# Patient Record
Sex: Female | Born: 1995 | Race: Black or African American | Hispanic: No | Marital: Single | Smoking: Former smoker
Health system: Southern US, Community
[De-identification: ages and names within clinical notes are randomized; demographics above are authoritative.]

## PROBLEM LIST (undated history)

## (undated) DIAGNOSIS — Z202 Contact with and (suspected) exposure to infections with a predominantly sexual mode of transmission: Secondary | ICD-10-CM

## (undated) DIAGNOSIS — D649 Anemia, unspecified: Secondary | ICD-10-CM

## (undated) DIAGNOSIS — F909 Attention-deficit hyperactivity disorder, unspecified type: Secondary | ICD-10-CM

## (undated) HISTORY — DX: Attention-deficit hyperactivity disorder, unspecified type: F90.9

## (undated) HISTORY — DX: Anemia, unspecified: D64.9

## (undated) HISTORY — PX: OTHER SURGICAL HISTORY: SHX169

## (undated) HISTORY — DX: Contact with and (suspected) exposure to infections with a predominantly sexual mode of transmission: Z20.2

---
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
## 2015-03-01 ENCOUNTER — Ambulatory Visit
Admission: RE | Admit: 2015-03-01 | Discharge: 2015-03-01 | Disposition: A | Discharge status: Home / Self Care | Payer: Medicaid Other | Source: Ambulatory Visit | Attending: Pediatrics | Admitting: Pediatrics

## 2015-03-01 ENCOUNTER — Other Ambulatory Visit: Payer: Self-pay | Admitting: Pediatrics

## 2015-03-01 DIAGNOSIS — S93602A Unspecified sprain of left foot, initial encounter: Secondary | ICD-10-CM

## 2015-03-01 DIAGNOSIS — W19XXXA Unspecified fall, initial encounter: Secondary | ICD-10-CM | POA: Insufficient documentation

## 2015-06-05 ENCOUNTER — Encounter: Payer: Self-pay | Admitting: Obstetrics and Gynecology

## 2015-06-05 ENCOUNTER — Ambulatory Visit (INDEPENDENT_AMBULATORY_CARE_PROVIDER_SITE_OTHER): Payer: Medicaid Other | Admitting: Obstetrics and Gynecology

## 2015-06-05 VITALS — BP 118/82 | HR 93 | Ht 62.5 in | Wt 129.4 lb

## 2015-06-05 DIAGNOSIS — R350 Frequency of micturition: Secondary | ICD-10-CM

## 2015-06-05 DIAGNOSIS — Z30018 Encounter for initial prescription of other contraceptives: Secondary | ICD-10-CM

## 2015-06-05 DIAGNOSIS — Z3049 Encounter for surveillance of other contraceptives: Secondary | ICD-10-CM

## 2015-06-05 LAB — POCT URINALYSIS DIPSTICK
Bilirubin, UA: NEGATIVE
Glucose, UA: NEGATIVE
KETONES UA: NEGATIVE
LEUKOCYTES UA: NEGATIVE
Nitrite, UA: NEGATIVE
PH UA: 7.5
PROTEIN UA: NEGATIVE
SPEC GRAV UA: 1.015
Urobilinogen, UA: NEGATIVE

## 2015-06-05 LAB — POCT URINE PREGNANCY: PREG TEST UR: NEGATIVE

## 2015-06-05 NOTE — Progress Notes (Signed)
GYNECOLOGY CLINIC PROGRESS NOTE  Subjective:    Brandi Watkins is a 19 y.o. P0 female who presents for contraception counseling. The patient has no complaints today. The patient is sexually active, currently 1 partner.  Coitarche at age 57. Pertinent past medical history: none.  Has used OCPs in the past, but discontinued due to them "making her sick".  Menstrual History: OB History    Gravida Para Term Preterm AB TAB SAB Ectopic Multiple Living   0 0 0 0 0 0 0 0 0 0       Menarche age: 10 No LMP recorded.    Past Medical History  Diagnosis Date  . Anemia   . ADHD (attention deficit hyperactivity disorder)     Family History  Problem Relation Age of Onset  . Diabetes Mother   . Hypertension Mother   . Asthma Maternal Aunt      Past Surgical History  Procedure Laterality Date  . None      Social History   Social History  . Marital Status: Single    Spouse Name: N/A  . Number of Children: N/A  . Years of Education: N/A   Occupational History  . Not on file.   Social History Main Topics  . Smoking status: Former Research scientist (life sciences)  . Smokeless tobacco: Never Used  . Alcohol Use: Yes  . Drug Use: Yes    Special: Marijuana  . Sexual Activity: Yes    Birth Control/ Protection: Condom   Other Topics Concern  . Not on file   Social History Narrative  . No narrative on file    No current outpatient prescriptions on file prior to visit.   No current facility-administered medications on file prior to visit.    No Known Allergies  Review of Systems A comprehensive review of systems was negative except for: Genitourinary: positive for frequency   Objective:  Blood pressure 118/82, pulse 93, height 5' 2.5" (1.588 m), weight 129 lb 6.4 oz (58.695 kg), last menstrual period 06/03/2015.  General appearance: NAD Remainder of exam deferred.     Labs:   Results for orders placed or performed in visit on 06/05/15  POCT urine pregnancy  Result Value Ref Range   Preg  Test, Ur Negative Negative  POCT urinalysis dipstick  Result Value Ref Range   Color, UA YELLOW    Clarity, UA CLEAR    Glucose, UA NEGATIVE    Bilirubin, UA NEGATIVE    Ketones, UA NEGATIVE    Spec Grav, UA 1.015    Blood, UA SMALL    pH, UA 7.5    Protein, UA NEGATIVE    Urobilinogen, UA negative    Nitrite, UA NEGATIVE    Leukocytes, UA Negative Negative    Assessment:    19 y.o., unsure of contraceptive desires, no contraindications.   Urinary freqency  Plan:  Reviewed all forms of birth control options available including abstinence; over the counter/barrier methods; hormonal contraceptive medication including pill, patch, ring, injection,contraceptive implant; hormonal and nonhormonal IUDs; permanent sterilization options including vasectomy and the various tubal sterilization modalities. Risks and benefits reviewed.  Questions were answered.  Information was given to patient to review.   After discussion, patient has decided on Nexplanon.  Will have patient f/u in 1 week for insertion.  UA negative today, UPT negative.  Patient informed.     Rubie Maid, MD [facility]

## 2015-06-12 ENCOUNTER — Ambulatory Visit (INDEPENDENT_AMBULATORY_CARE_PROVIDER_SITE_OTHER): Payer: Medicaid Other | Admitting: Obstetrics and Gynecology

## 2015-06-12 ENCOUNTER — Encounter: Payer: Self-pay | Admitting: Obstetrics and Gynecology

## 2015-06-12 VITALS — BP 104/68 | Ht 62.5 in | Wt 128.7 lb

## 2015-06-12 DIAGNOSIS — Z30017 Encounter for initial prescription of implantable subdermal contraceptive: Secondary | ICD-10-CM

## 2015-06-12 NOTE — Patient Instructions (Signed)
Etonogestrel implant What is this medicine? ETONOGESTREL (et oh noe JES trel) is a contraceptive (birth control) device. It is used to prevent pregnancy. It can be used for up to 3 years. This medicine may be used for other purposes; ask your health care provider or pharmacist if you have questions. What should I tell my health care provider before I take this medicine? They need to know if you have any of these conditions: -abnormal vaginal bleeding -blood vessel disease or blood clots -cancer of the breast, cervix, or liver -depression -diabetes -gallbladder disease -headaches -heart disease or recent heart attack -high blood pressure -high cholesterol -kidney disease -liver disease -renal disease -seizures -tobacco smoker -an unusual or allergic reaction to etonogestrel, other hormones, anesthetics or antiseptics, medicines, foods, dyes, or preservatives -pregnant or trying to get pregnant -breast-feeding How should I use this medicine? This device is inserted just under the skin on the inner side of your upper arm by a health care professional. Talk to your pediatrician regarding the use of this medicine in children. Special care may be needed. Overdosage: If you think you have taken too much of this medicine contact a poison control center or emergency room at once. NOTE: This medicine is only for you. Do not share this medicine with others. What if I miss a dose? This does not apply. What may interact with this medicine? Do not take this medicine with any of the following medications: -amprenavir -bosentan -fosamprenavir This medicine may also interact with the following medications: -barbiturate medicines for inducing sleep or treating seizures -certain medicines for fungal infections like ketoconazole and itraconazole -griseofulvin -medicines to treat seizures like carbamazepine, felbamate, oxcarbazepine, phenytoin,  topiramate -modafinil -phenylbutazone -rifampin -some medicines to treat HIV infection like atazanavir, indinavir, lopinavir, nelfinavir, tipranavir, ritonavir -St. John's wort This list may not describe all possible interactions. Give your health care provider a list of all the medicines, herbs, non-prescription drugs, or dietary supplements you use. Also tell them if you smoke, drink alcohol, or use illegal drugs. Some items may interact with your medicine. What should I watch for while using this medicine? This product does not protect you against HIV infection (AIDS) or other sexually transmitted diseases. You should be able to feel the implant by pressing your fingertips over the skin where it was inserted. Contact your doctor if you cannot feel the implant, and use a non-hormonal birth control method (such as condoms) until your doctor confirms that the implant is in place. If you feel that the implant may have broken or become bent while in your arm, contact your healthcare provider. What side effects may I notice from receiving this medicine? Side effects that you should report to your doctor or health care professional as soon as possible: -allergic reactions like skin rash, itching or hives, swelling of the face, lips, or tongue -breast lumps -changes in emotions or moods -depressed mood -heavy or prolonged menstrual bleeding -pain, irritation, swelling, or bruising at the insertion site -scar at site of insertion -signs of infection at the insertion site such as fever, and skin redness, pain or discharge -signs of pregnancy -signs and symptoms of a blood clot such as breathing problems; changes in vision; chest pain; severe, sudden headache; pain, swelling, warmth in the leg; trouble speaking; sudden numbness or weakness of the face, arm or leg -signs and symptoms of liver injury like dark yellow or brown urine; general ill feeling or flu-like symptoms; light-colored stools; loss of  appetite; nausea; right upper belly   pain; unusually weak or tired; yellowing of the eyes or skin -unusual vaginal bleeding, discharge -signs and symptoms of a stroke like changes in vision; confusion; trouble speaking or understanding; severe headaches; sudden numbness or weakness of the face, arm or leg; trouble walking; dizziness; loss of balance or coordination Side effects that usually do not require medical attention (Report these to your doctor or health care professional if they continue or are bothersome.): -acne -back pain -breast pain -changes in weight -dizziness -general ill feeling or flu-like symptoms -headache -irregular menstrual bleeding -nausea -sore throat -vaginal irritation or inflammation This list may not describe all possible side effects. Call your doctor for medical advice about side effects. You may report side effects to FDA at 1-800-FDA-1088. Where should I keep my medicine? This drug is given in a hospital or clinic and will not be stored at home. NOTE: This sheet is a summary. It may not cover all possible information. If you have questions about this medicine, talk to your doctor, pharmacist, or health care provider.    2016, Elsevier/Gold Standard. (2014-04-13 14:07:06)  

## 2015-06-12 NOTE — Progress Notes (Signed)
GYNECOLOGY CLINIC PROCEDURE NOTE  Brandi Watkins is a 19 y.o. G0P0000 here for Nexplanon insertion. No pap history.  No other gynecologic concerns.  Patient's last menstrual period was 06/12/2015. Still ongoing.   Vitals:  Blood pressure 104/68, height 5' 2.5" (1.588 m), weight 128 lb 11.2 oz (58.378 kg), last menstrual period 06/12/2015.  Nexplanon Insertion Procedure Patient identified, informed consent performed, consent signed.   Patient does understand that irregular bleeding is a very common side effect of this medication. She was advised to have backup contraception for one week after placement. Pregnancy test not performed as patient still currently on menses.  Patient's left arm was prepped and draped in the usual sterile fashion.. The ruler used to measure and mark insertion area.  Patient was prepped with alcohol swab and then injected with 3 ml of 1% lidocaine.  She was prepped with betadine, Nexplanon removed from packaging,  Device confirmed in needle, then inserted full length of needle and withdrawn per handbook instructions. Nexplanon was able to palpated in the patient's arm; patient palpated the insert herself. There was minimal blood loss.  Patient insertion site covered with guaze and a pressure bandage to reduce any bruising.  The patient tolerated the procedure well and was given post procedure instructions.     Rubie Maid, MD [facility]

## 2015-06-15 ENCOUNTER — Emergency Department
Admission: EM | Admit: 2015-06-15 | Discharge: 2015-06-15 | Disposition: A | Discharge status: Home / Self Care | Payer: Medicaid Other | Attending: Emergency Medicine | Admitting: Emergency Medicine

## 2015-06-15 ENCOUNTER — Encounter: Payer: Self-pay | Admitting: Emergency Medicine

## 2015-06-15 DIAGNOSIS — S60541A External constriction of right hand, initial encounter: Secondary | ICD-10-CM | POA: Insufficient documentation

## 2015-06-15 DIAGNOSIS — Z87891 Personal history of nicotine dependence: Secondary | ICD-10-CM | POA: Diagnosis not present

## 2015-06-15 DIAGNOSIS — Y9289 Other specified places as the place of occurrence of the external cause: Secondary | ICD-10-CM | POA: Diagnosis not present

## 2015-06-15 DIAGNOSIS — W4904XA Ring or other jewelry causing external constriction, initial encounter: Secondary | ICD-10-CM | POA: Insufficient documentation

## 2015-06-15 DIAGNOSIS — Y9389 Activity, other specified: Secondary | ICD-10-CM | POA: Insufficient documentation

## 2015-06-15 DIAGNOSIS — S6991XA Unspecified injury of right wrist, hand and finger(s), initial encounter: Secondary | ICD-10-CM | POA: Diagnosis present

## 2015-06-15 DIAGNOSIS — Y998 Other external cause status: Secondary | ICD-10-CM | POA: Diagnosis not present

## 2015-06-15 DIAGNOSIS — Z79899 Other long term (current) drug therapy: Secondary | ICD-10-CM | POA: Insufficient documentation

## 2015-06-15 NOTE — ED Provider Notes (Signed)
[facility] Emergency Department Provider Note ___________________________________________  Time seen: Approximately 10:17 AM  I have reviewed the triage vital signs and the nursing notes.   HISTORY  Chief Complaint Hand Pain   HPI Brandi Watkins is a 19 y.o. female who presents to the emergency department for removal. She states last night she put a ring on her right fifth finger and is unable to remove it. She requests that it be cut off.   Past Medical History  Diagnosis Date  . Anemia   . ADHD (attention deficit hyperactivity disorder)     There are no active problems to display for this patient.   Past Surgical History  Procedure Laterality Date  . None      Current Outpatient Rx  Name  Route  Sig  Dispense  Refill  . etonogestrel (NEXPLANON) 68 MG IMPL implant   Subdermal   1 each by Subdermal route once.           Allergies Review of patient's allergies indicates no known allergies.  Family History  Problem Relation Age of Onset  . Diabetes Mother   . Hypertension Mother   . Asthma Maternal Aunt     Social History Social History  Substance Use Topics  . Smoking status: Former Research scientist (life sciences)  . Smokeless tobacco: Never Used  . Alcohol Use: Yes    Review of Systems Constitutional: No recent illness. Eyes: No visual changes. ENT: No sore throat. Cardiovascular: Denies chest pain or palpitations. Respiratory: Denies shortness of breath. Gastrointestinal: No abdominal pain.  Genitourinary: Negative for dysuria. Musculoskeletal: Pain in right fifth finger Skin: Negative for rash. Neurological: Negative for headaches, focal weakness or numbness. 10-point ROS otherwise negative.  ____________________________________________   PHYSICAL EXAM:  VITAL SIGNS: ED Triage Vitals  Enc Vitals Group     BP 06/15/15 0926 115/67 mmHg     Pulse Rate 06/15/15 0926 90     Resp 06/15/15 0926 18     Temp 06/15/15 0926 98.2 F (36.8 C)      Temp Source 06/15/15 0926 Oral     SpO2 06/15/15 0926 100 %     Weight 06/15/15 0926 120 lb (54.432 kg)     Height 06/15/15 0926 5\' 2"  (1.575 m)     Head Cir --      Peak Flow --      Pain Score --      Pain Loc --      Pain Edu? --      Excl. in [place]? --     Constitutional: Alert and oriented. Well appearing and in no acute distress. Eyes: Conjunctivae are normal. EOMI. Head: Atraumatic. Nose: No congestion/rhinnorhea. Neck: No stridor.  Respiratory: Normal respiratory effort.   Musculoskeletal: Mild swelling of the right fifth finger noted with a ring causing constriction. Neurologic:  Normal speech and language. No gross focal neurologic deficits are appreciated. Speech is normal. No gait instability. Skin:  Skin is warm, dry and intact. Atraumatic. Psychiatric: Mood and affect are normal. Speech and behavior are normal.  ____________________________________________   LABS (all labs ordered are listed, but only abnormal results are displayed)  Labs Reviewed - No data to display ____________________________________________  RADIOLOGY   ____________________________________________   PROCEDURES  Procedure(s) performed:   Ring removed with an electronic ring cutter. No immediate complications.   ____________________________________________   INITIAL IMPRESSION / ASSESSMENT AND PLAN / ED COURSE  Pertinent labs & imaging results that were available during my care of the  patient were reviewed by me and considered in my medical decision making (see chart for details).  Offer was made to remove the ring intact however patient refused and requested that the ring be cut off. He was handed to the patient after removal. Patient was advised follow-up with her primary care provider or return to the emergency department for symptoms of concern. ____________________________________________   FINAL CLINICAL IMPRESSION(S) / ED DIAGNOSES  Final diagnoses:  Ring or other  jewelry causing external constriction, initial encounter       Victorino Dike, FNP 06/15/15 [place], MD 06/15/15 [id number]

## 2015-06-15 NOTE — ED Notes (Signed)
Reports putting a ring on her pinky finger last pm and it is stuck.  Mild swelling noted around it.

## 2015-06-15 NOTE — ED Notes (Signed)
Pt presents with ring to right fifth finger she states she cannot remove and is cutting off her circulation. Pt requests removal. Capillary refill brisk, finger slightly swollen and red.

## 2015-07-30 ENCOUNTER — Ambulatory Visit (INDEPENDENT_AMBULATORY_CARE_PROVIDER_SITE_OTHER): Payer: Medicaid Other | Admitting: Obstetrics and Gynecology

## 2015-07-30 ENCOUNTER — Encounter: Payer: Self-pay | Admitting: Obstetrics and Gynecology

## 2015-07-30 VITALS — BP 123/79 | HR 86 | Wt 128.4 lb

## 2015-07-30 DIAGNOSIS — N921 Excessive and frequent menstruation with irregular cycle: Secondary | ICD-10-CM | POA: Diagnosis not present

## 2015-07-30 DIAGNOSIS — R5383 Other fatigue: Secondary | ICD-10-CM | POA: Diagnosis not present

## 2015-07-30 DIAGNOSIS — Z975 Presence of (intrauterine) contraceptive device: Secondary | ICD-10-CM | POA: Diagnosis not present

## 2015-07-30 NOTE — Progress Notes (Signed)
    GYNECOLOGY PROGRESS NOTE  Subjective:    Patient ID: Brandi Watkins, female    DOB: January 09, 1996, 20 y.o.   MRN: [id number]  HPI  Patient is a 20 y.o. G0P0 female who presents for complaints of persistent bleeding on Nexplanon and worries of being anemic as she has a prior h/o anemia. Notes that the first week after Nexplanon she bled heavily (using 3-4 super tampons per day), then the following weeks were lighter bleeding requiring only 1 tampon per day. Has symptoms of fatigue, feeling dizzy. Has had Nexplanon in for ~ 6 weeks.  Notes that the bleeding finally stopped 2 days ago.   The following portions of the patient's history were reviewed and updated as appropriate: allergies, current medications, past family history, past medical history, past social history, past surgical history and problem list.  Review of Systems Pertinent items noted in HPI and remainder of comprehensive ROS otherwise negative.   Objective:   Blood pressure 123/79, pulse 86, weight 128 lb 7 oz (58.259 kg), last menstrual period 06/29/2015. General appearance: alert and no distress  Eyes: negative findings: lids and lashes normal and pupils equal, round, reactive to light and accomodation, positive findings: sclera clear, conjunctivae with mild pallor Pelvic: deferred Neurologic: Grossly normal   Assessment:   Breakthrough bleeding with Nexplanon Fatigue and dizziness H/o anemia  Plan:   Breakthrough bleeding appears to have spontaneously resolved.  Reiterated to patient that abnormal bleeding can occur for up to 3 months (and sometimes as long as 6 months) after Nexplanon placement.  Patient notes understanding.  Will order Hgb/Hct to assess for signs of anemia, as patient reports h/o anemia in the past.  Advised on taking multivitamin daily which contains iron, and if labs show evidence of anemia, may need additional iron supplementation.  Also advised on adequate hydration for symptoms (as patient  notes that she does not consume enough water daily).  RTC as needed.    Rubie Maid, MD [facility]

## 2015-07-30 NOTE — Progress Notes (Signed)
Patient ID: Brandi Watkins, female   DOB: 1996/01/05, 20 y.o.   MRN: [id number] Pt presents for c/o vaginal bleeding with nexplanon. Bled after nexplanon inserted x 1 month. LMP: 06/29/2015. Has history of anemia and pt states she feels as though she may be anemic now. C/O fatigue, dizziness (before leaving for work, when she got to work and also gets dizzy at night, bruises easily.

## 2015-07-31 ENCOUNTER — Telehealth: Payer: Self-pay

## 2015-07-31 ENCOUNTER — Ambulatory Visit: Payer: Medicaid Other | Admitting: Obstetrics and Gynecology

## 2015-07-31 LAB — HEMOGLOBIN AND HEMATOCRIT, BLOOD
Hematocrit: 40.2 % (ref 34.0–46.6)
Hemoglobin: 13.5 g/dL (ref 11.1–15.9)

## 2015-07-31 NOTE — Telephone Encounter (Signed)
-----   Message from Rubie Maid, MD sent at 07/31/2015  8:50 AM EST ----- Please inform patient that her Hgb is normal.  Advised on adequate hydration (6-8 cups of water daily) for dizziness, and taking multivitamin.

## 2015-07-31 NOTE — Telephone Encounter (Signed)
Called pt left vm informing her of information below.

## 2015-09-17 ENCOUNTER — Encounter: Payer: Self-pay | Admitting: Obstetrics and Gynecology

## 2015-09-17 ENCOUNTER — Ambulatory Visit (INDEPENDENT_AMBULATORY_CARE_PROVIDER_SITE_OTHER): Payer: Medicaid Other | Admitting: Obstetrics and Gynecology

## 2015-09-17 VITALS — BP 105/68 | HR 111 | Ht 62.0 in | Wt 128.0 lb

## 2015-09-17 DIAGNOSIS — N921 Excessive and frequent menstruation with irregular cycle: Secondary | ICD-10-CM | POA: Diagnosis not present

## 2015-09-17 DIAGNOSIS — Z975 Presence of (intrauterine) contraceptive device: Secondary | ICD-10-CM

## 2015-09-17 DIAGNOSIS — R531 Weakness: Secondary | ICD-10-CM

## 2015-09-17 MED ORDER — ESTRADIOL 1 MG PO TABS: 1.0000 mg | ORAL_TABLET | Freq: Every day | ORAL | Status: DC

## 2015-09-18 ENCOUNTER — Telehealth: Payer: Self-pay

## 2015-09-18 LAB — HEMOGLOBIN: HEMOGLOBIN: 13 g/dL (ref 11.1–15.9)

## 2015-09-18 NOTE — Telephone Encounter (Signed)
Called pt LM for her informing her of information below.

## 2015-09-18 NOTE — Telephone Encounter (Signed)
-----   Message from Rubie Maid, MD sent at 09/18/2015 11:57 AM EST ----- Please call and inform patient that her Hgb remains stable (normal range, currently 13)

## 2015-09-22 NOTE — Progress Notes (Signed)
    GYNECOLOGY PROGRESS NOTE  Subjective:    Patient ID: Brandi Watkins, female    DOB: 10-20-1995, 20 y.o.   MRN: [id number]  Dizziness  Vaginal Bleeding    Patient is a 20 y.o. G0P0 female who presents for complaints of persistent bleeding on Nexplanon and is still worried of being anemic as she has a prior h/o anemia. Reports that she is still having daily bleeding, sometimes heavy, sometimes light (i.e. Spotting).  Still notes symptoms of of fatigue and weakness. Has had Nexplanon in place for 3 months.     The following portions of the patient's history were reviewed and updated as appropriate: allergies, current medications, past family history, past medical history, past social history, past surgical history and problem list.  Review of Systems Pertinent items noted in HPI and remainder of comprehensive ROS otherwise negative.   Objective:   Blood pressure 105/68, pulse 111, height 5\' 2"  (1.575 m), weight 128 lb (58.06 kg), last menstrual period 09/17/2015. General appearance: alert and no distress  Head: Normocephalic, without obvious abnormality, atraumatic Eyes: conjunctivae/corneas clear. PERRL, EOM's intact.  Ears: normal external ear canals both ears Nose: Nares normal. Septum midline. Mucosa normal. No drainage or sinus tenderness. Throat: lips, mucosa, and tongue normal; teeth and gums normal Neck: no adenopathy, no carotid bruit, no JVD, supple, symmetrical, trachea midline and thyroid not enlarged, symmetric, no tenderness/mass/nodules Lungs: clear to auscultation bilaterally Heart: regular rate and rhythm, S1, S2 normal, no murmur, click, rub or gallop Pelvic: deferred Neurologic: Grossly normal   Results for ZYAUNA, DOMENICO (MRN [id number]) as of 09/17/2015 14:36  Ref. Range 07/30/2015 15:38  Hemoglobin Latest Ref Range: 11.1-15.9 g/dL 13.5  HCT Latest Ref Range: 34.0-46.6 % 40.2   . Assessment:   Breakthrough bleeding with Nexplanon Fatigue and  weakness H/o anemia  Plan:   Breakthrough bleeding persistent (although previously had resolved by last visit).  Reiterated to patient that abnormal bleeding can occur for up to 3 months (and sometimes as long as 6 months) after Nexplanon placement.  Discussed option of trial of supplemental estrogen for 1-2 months vs removal of Nexplanon.  Patient desires to try Estrogen.  Will prescribe Estradiol 1 mg x 14 days per month x 2 months. If still no improvement in symptoms, can remove Nexplanon and discuss other forms of contraception.  Will recheck Hgb to assess for signs of anemia, as patient reports h/o anemia in the past and is insistent on rechecking levels, although levels checked 2 months ago were normal.   RTC in 2-3 months after add back estrogen therapy trial.     Rubie Maid, MD [facility]

## 2015-10-09 ENCOUNTER — Ambulatory Visit: Payer: Medicaid Other | Admitting: Obstetrics and Gynecology

## 2015-10-27 ENCOUNTER — Encounter: Payer: Self-pay | Admitting: Emergency Medicine

## 2015-10-27 ENCOUNTER — Emergency Department
Admission: EM | Admit: 2015-10-27 | Discharge: 2015-10-27 | Disposition: A | Discharge status: Home / Self Care | Payer: Medicaid Other | Attending: Emergency Medicine | Admitting: Emergency Medicine

## 2015-10-27 DIAGNOSIS — Y999 Unspecified external cause status: Secondary | ICD-10-CM | POA: Insufficient documentation

## 2015-10-27 DIAGNOSIS — R21 Rash and other nonspecific skin eruption: Secondary | ICD-10-CM | POA: Insufficient documentation

## 2015-10-27 DIAGNOSIS — Z87891 Personal history of nicotine dependence: Secondary | ICD-10-CM | POA: Insufficient documentation

## 2015-10-27 DIAGNOSIS — W57XXXA Bitten or stung by nonvenomous insect and other nonvenomous arthropods, initial encounter: Secondary | ICD-10-CM | POA: Insufficient documentation

## 2015-10-27 DIAGNOSIS — Y929 Unspecified place or not applicable: Secondary | ICD-10-CM | POA: Insufficient documentation

## 2015-10-27 DIAGNOSIS — Y939 Activity, unspecified: Secondary | ICD-10-CM | POA: Insufficient documentation

## 2015-10-27 DIAGNOSIS — F909 Attention-deficit hyperactivity disorder, unspecified type: Secondary | ICD-10-CM | POA: Insufficient documentation

## 2015-10-27 MED ORDER — DIPHENHYDRAMINE HCL 2 % EX CREA: TOPICAL_CREAM | Freq: Three times a day (TID) | CUTANEOUS | Status: DC | PRN

## 2015-10-27 MED ORDER — CEPHALEXIN 500 MG PO CAPS: 500.0000 mg | ORAL_CAPSULE | Freq: Three times a day (TID) | ORAL | Status: DC

## 2015-10-27 NOTE — ED Notes (Signed)
Pt presents with 'spider bites" to the top of each breast; did not see anything bite her; noticed yesterday; c/o itching to areas

## 2015-10-27 NOTE — Discharge Instructions (Signed)
As we discussed please use a topical Benadryl cream 3 times daily for the next 3-4 days. If symptoms do not resolve or the redness begins to spread or you develop a fever please fill and begin taking her prescribed antibiotic. If the rash appears to be resolving with the use of topical Benadryl please do not fill the antibiotic. Return to the emergency department for any worsening symptoms, otherwise please follow-up with your primary care physician in 2-3 days for recheck/reevaluation.     Rash    A rash is a change in the color or texture of the skin. There are many different types of rashes. You may have other problems that accompany your rash.  CAUSES  Infections.  Allergic reactions. This can include allergies to pets or foods.  Certain medicines.  Exposure to certain chemicals, soaps, or cosmetics.  Heat.  Exposure to poisonous plants.  Tumors, both cancerous and noncancerous. SYMPTOMS  Redness.  Scaly skin.  Itchy skin.  Dry or cracked skin.  Bumps.  Blisters.  Pain. DIAGNOSIS  Your caregiver may do a physical exam to determine what type of rash you have. A skin sample (biopsy) may be taken and examined under a microscope.  TREATMENT  Treatment depends on the type of rash you have. Your caregiver may prescribe certain medicines. For serious conditions, you may need to see a skin doctor (dermatologist).  HOME CARE INSTRUCTIONS  Avoid the substance that caused your rash.  Do not scratch your rash. This can cause infection.  You may take cool baths to help stop itching.  Only take over-the-counter or prescription medicines as directed by your caregiver.  Keep all follow-up appointments as directed by your caregiver. SEEK IMMEDIATE MEDICAL CARE IF:  You have increasing pain, swelling, or redness.  You have a fever.  You have new or severe symptoms.  You have body aches, diarrhea, or vomiting.  Your rash is not better after 3 days. MAKE SURE YOU:  Understand these  instructions.  Will watch your condition.  Will get help right away if you are not doing well or get worse. This information is not intended to replace advice given to you by your health care provider. Make sure you discuss any questions you have with your health care provider.  Document Released: 06/19/2002 Document Revised: 07/20/2014 Document Reviewed: 11/14/2014  Elsevier Interactive Patient Education [employer].

## 2015-10-27 NOTE — ED Notes (Signed)
Pt in via triage w/ complaints of two spider bites, one located on each breast, both areas red, swollen, itching.  Pt denies any pain/tenderness.

## 2015-10-27 NOTE — ED Provider Notes (Signed)
[facility] Emergency Department Provider Note  Time seen: 10:25 PM  I have reviewed the triage vital signs and the nursing notes.   HISTORY  Chief Complaint Insect Bite    HPI Brandi Watkins is a 20 y.o. female with a past medical history of anemia, ADHD, presents the emergency department with possible spider bites. According to the patient since last night she has noticed to read itching areas to the top of both breasts. Denies any pain. Denies any known bites. Denies any fever or vomiting. Patient states moderate itching and redness.     Past Medical History  Diagnosis Date  . Anemia   . ADHD (attention deficit hyperactivity disorder)     There are no active problems to display for this patient.   Past Surgical History  Procedure Laterality Date  . None      Current Outpatient Rx  Name  Route  Sig  Dispense  Refill  . estradiol (ESTRACE) 1 MG tablet   Oral   Take 1 tablet (1 mg total) by mouth daily. 1 tablet daily x 14 days each month, for 2 months.   30 tablet   0   . etonogestrel (NEXPLANON) 68 MG IMPL implant   Subdermal   1 each by Subdermal route once.           Allergies Review of patient's allergies indicates no known allergies.  Family History  Problem Relation Age of Onset  . Diabetes Mother   . Hypertension Mother   . Asthma Maternal Aunt     Social History Social History  Substance Use Topics  . Smoking status: Former Research scientist (life sciences)  . Smokeless tobacco: Never Used  . Alcohol Use: No    Review of Systems Constitutional: Negative for fever. Cardiovascular: Negative for chest pain. Respiratory: Negative for shortness of breath. Gastrointestinal: Negative for abdominal pain Musculoskeletal: Negative for back pain. Skin: Red areas to the top of each breast/chest  10-point ROS otherwise negative.  ____________________________________________   PHYSICAL EXAM:  VITAL SIGNS: ED Triage Vitals  Enc Vitals Group      BP 10/27/15 2147 131/81 mmHg     Pulse Rate 10/27/15 2147 90     Resp 10/27/15 2147 18     Temp 10/27/15 2147 98.3 F (36.8 C)     Temp Source 10/27/15 2147 Oral     SpO2 10/27/15 2147 100 %     Weight 10/27/15 2147 129 lb (58.514 kg)     Height 10/27/15 2147 5\' 2"  (1.575 m)     Head Cir --      Peak Flow --      Pain Score 10/27/15 2148 3     Pain Loc --      Pain Edu? --      Excl. in [place]? --    Constitutional: Alert and oriented. Well appearing and in no distress. Eyes: Normal exam ENT   Head: Normocephalic and atraumatic.   Mouth/Throat: Mucous membranes are moist. Cardiovascular: Normal rate, regular rhythm.  Respiratory: Normal respiratory effort without tachypnea nor retractions. Breath sounds are clear  Gastrointestinal: Soft and nontender. No distention.  Musculoskeletal:Normal range of motion, ambulates without issue.  Neurologic:  Normal speech and language. No gross focal neurologic deficits Skin: Patient has 2 areas, each of which is approximately 1.5 cm in diameter of erythema with small papules to the top of each breast/chest. No signs of abscess.  Psychiatric: Mood and affect are normal.   ____________________________________________  INITIAL IMPRESSION / ASSESSMENT AND PLAN / ED COURSE  Pertinent labs & imaging results that were available during my care of the patient were reviewed by me and considered in my medical decision making (see chart for details).  Patient presents the emergency department to itching red areas to her chest. Good possibly be due to insect bites, or irritation causing her to itch the areas. Less likely to be infectious. I discussed with the patient a trial of topical Benadryl to the area for the next 48 hours. If the redness spreads or becomes painful the patient is to fill and begin taking a prescription for Keflex which I will provide her. Patient is agreeable to this plan.   ____________________________________________   FINAL CLINICAL IMPRESSION(S) / ED DIAGNOSES  Rash   Harvest Dark, MD 10/27/15 2228

## 2015-11-19 ENCOUNTER — Ambulatory Visit: Payer: Medicaid Other | Admitting: Obstetrics and Gynecology

## 2015-12-08 ENCOUNTER — Encounter: Payer: Self-pay | Admitting: Emergency Medicine

## 2015-12-08 ENCOUNTER — Emergency Department
Admission: EM | Admit: 2015-12-08 | Discharge: 2015-12-08 | Disposition: A | Discharge status: Home / Self Care | Payer: Medicaid Other | Attending: Emergency Medicine | Admitting: Emergency Medicine

## 2015-12-08 DIAGNOSIS — S0086XA Insect bite (nonvenomous) of other part of head, initial encounter: Secondary | ICD-10-CM | POA: Insufficient documentation

## 2015-12-08 DIAGNOSIS — S40862A Insect bite (nonvenomous) of left upper arm, initial encounter: Secondary | ICD-10-CM | POA: Insufficient documentation

## 2015-12-08 DIAGNOSIS — Y999 Unspecified external cause status: Secondary | ICD-10-CM | POA: Insufficient documentation

## 2015-12-08 DIAGNOSIS — F909 Attention-deficit hyperactivity disorder, unspecified type: Secondary | ICD-10-CM | POA: Insufficient documentation

## 2015-12-08 DIAGNOSIS — Y939 Activity, unspecified: Secondary | ICD-10-CM | POA: Insufficient documentation

## 2015-12-08 DIAGNOSIS — F1219 Cannabis abuse with unspecified cannabis-induced disorder: Secondary | ICD-10-CM | POA: Insufficient documentation

## 2015-12-08 DIAGNOSIS — Y929 Unspecified place or not applicable: Secondary | ICD-10-CM | POA: Insufficient documentation

## 2015-12-08 DIAGNOSIS — Z87891 Personal history of nicotine dependence: Secondary | ICD-10-CM | POA: Insufficient documentation

## 2015-12-08 DIAGNOSIS — W57XXXA Bitten or stung by nonvenomous insect and other nonvenomous arthropods, initial encounter: Secondary | ICD-10-CM | POA: Insufficient documentation

## 2015-12-08 MED ORDER — PREDNISONE 10 MG (21) PO TBPK: ORAL_TABLET | ORAL | Status: DC

## 2015-12-08 MED ORDER — PREDNISONE 20 MG PO TABS
60.0000 mg | ORAL_TABLET | Freq: Once | ORAL | Status: AC
  Administered 2015-12-08: 60 mg via ORAL
  Filled 2015-12-08: qty 3

## 2015-12-08 MED ORDER — HYDROXYZINE HCL 25 MG PO TABS: 25.0000 mg | ORAL_TABLET | Freq: Three times a day (TID) | ORAL | Status: DC | PRN

## 2015-12-08 MED ORDER — HYDROXYZINE HCL 25 MG PO TABS
25.0000 mg | ORAL_TABLET | Freq: Once | ORAL | Status: AC
  Administered 2015-12-08: 25 mg via ORAL
  Filled 2015-12-08: qty 1

## 2015-12-08 NOTE — ED Provider Notes (Signed)
[facility] Emergency Department Provider Note  ____________________________________________  Time seen: Approximately 9:52 PM  I have reviewed the triage vital signs and the nursing notes.   HISTORY  Chief Complaint Insect Bite   HPI Chara V Crecco is a 20 y.o. female who presents to the emergency department for evaluation of bites on her skin. She states that she stayed in the [employer] with her sister who awakened feeling something on her and when the light was turned on "bed bugs were everywhere." Patient did not feel the bites until the next day. She has tried caladryl and cortisone 10 without relief.   Past Medical History  Diagnosis Date  . Anemia   . ADHD (attention deficit hyperactivity disorder)     There are no active problems to display for this patient.   Past Surgical History  Procedure Laterality Date  . None      Current Outpatient Rx  Name  Route  Sig  Dispense  Refill  . cephALEXin (KEFLEX) 500 MG capsule   Oral   Take 1 capsule (500 mg total) by mouth 3 (three) times daily.   15 capsule   0   . diphenhydrAMINE (BENADRYL) 2 % cream   Topical   Apply topically 3 (three) times daily as needed for itching.   30 g   0   . estradiol (ESTRACE) 1 MG tablet   Oral   Take 1 tablet (1 mg total) by mouth daily. 1 tablet daily x 14 days each month, for 2 months.   30 tablet   0   . etonogestrel (NEXPLANON) 68 MG IMPL implant   Subdermal   1 each by Subdermal route once.         . hydrOXYzine (ATARAX/VISTARIL) 25 MG tablet   Oral   Take 1 tablet (25 mg total) by mouth 3 (three) times daily as needed.   30 tablet   0   . predniSONE (STERAPRED UNI-PAK 21 TAB) 10 MG (21) TBPK tablet      Take 6 tablets on day 1 Take 5 tablets on day 2 Take 4 tablets on day 3 Take 3 tablets on day 4 Take 2 tablets on day 5 Take 1 tablet on day 6   21 tablet   0     Allergies Review of patient's allergies indicates no known  allergies.  Family History  Problem Relation Age of Onset  . Diabetes Mother   . Hypertension Mother   . Asthma Maternal Aunt     Social History Social History  Substance Use Topics  . Smoking status: Former Research scientist (life sciences)  . Smokeless tobacco: Never Used  . Alcohol Use: No    Review of Systems  Constitutional: Negative for fever/chills Respiratory: Negative for shortness of breath. Musculoskeletal: Negative for pain. Skin: Positive for skin lesions Neurological: Negative for headaches, focal weakness or numbness. ____________________________________________   PHYSICAL EXAM:  VITAL SIGNS: ED Triage Vitals  Enc Vitals Group     BP --      Pulse --      Resp --      Temp --      Temp src --      SpO2 --      Weight --      Height --      Head Cir --      Peak Flow --      Pain Score --      Pain Loc --  Pain Edu? --      Excl. in [place]? --      Constitutional: Alert and oriented. Well appearing and in no acute distress. Eyes: Conjunctivae are normal.  EOMI. Mouth/Throat: Mucous membranes are moist.   Neck: No stridor. Lymphatic: No cervical lymphadenopathy. Cardiovascular: Good peripheral circulation. Respiratory: Normal respiratory effort.  No retractions. Lungs clear to auscultation. Musculoskeletal: FROM throughout. Neurologic:  Normal speech and language. No gross focal neurologic deficits are appreciated. Skin: Annular, erythematous, raised lesions in groups and scattered over bilateral upper extremities, hands, and face with central pinpoint dark area consistent with bed bug bites.  ____________________________________________   LABS (all labs ordered are listed, but only abnormal results are displayed)  Labs Reviewed - No data to display ____________________________________________  EKG   ____________________________________________  RADIOLOGY   ____________________________________________   PROCEDURES  Procedure(s) performed:  None ____________________________________________   INITIAL IMPRESSION / ASSESSMENT AND PLAN / ED COURSE  Pertinent labs & imaging results that were available during my care of the patient were reviewed by me and considered in my medical decision making (see chart for details).  She will be advised to take the prednisone and vistaril as prescribed.  She was advised to follow up with dermatology if not improving over 14 days.  She was also advised to return to the emergency department for symptoms that change or worsen if unable to schedule an appointment.  ____________________________________________   FINAL CLINICAL IMPRESSION(S) / ED DIAGNOSES  Final diagnoses:  Bed bug bite       Victorino Dike, FNP 12/08/15 2201  Schuyler Amor, MD 12/08/15 2209

## 2015-12-08 NOTE — ED Notes (Signed)
Pt to ED via POV with c/o bug bites. Pt has large bites that are red and swollen to bilateral upper extremities and on face. Pt states she recently stayed [place] a motel where bed bugs were present

## 2016-05-26 ENCOUNTER — Emergency Department
Admission: EM | Admit: 2016-05-26 | Discharge: 2016-05-26 | Disposition: A | Discharge status: Home / Self Care | Payer: Medicaid Other | Attending: Emergency Medicine | Admitting: Emergency Medicine

## 2016-05-26 ENCOUNTER — Encounter: Payer: Self-pay | Admitting: Emergency Medicine

## 2016-05-26 ENCOUNTER — Encounter: Payer: Self-pay | Admitting: Obstetrics and Gynecology

## 2016-05-26 ENCOUNTER — Ambulatory Visit (INDEPENDENT_AMBULATORY_CARE_PROVIDER_SITE_OTHER): Payer: Medicaid Other | Admitting: Obstetrics and Gynecology

## 2016-05-26 VITALS — BP 101/65 | HR 98 | Ht 62.0 in | Wt 127.1 lb

## 2016-05-26 DIAGNOSIS — N921 Excessive and frequent menstruation with irregular cycle: Secondary | ICD-10-CM | POA: Diagnosis not present

## 2016-05-26 DIAGNOSIS — Y69 Unspecified misadventure during surgical and medical care: Secondary | ICD-10-CM | POA: Insufficient documentation

## 2016-05-26 DIAGNOSIS — Z5321 Procedure and treatment not carried out due to patient leaving prior to being seen by health care provider: Secondary | ICD-10-CM | POA: Insufficient documentation

## 2016-05-26 DIAGNOSIS — F17200 Nicotine dependence, unspecified, uncomplicated: Secondary | ICD-10-CM | POA: Insufficient documentation

## 2016-05-26 DIAGNOSIS — Z3046 Encounter for surveillance of implantable subdermal contraceptive: Secondary | ICD-10-CM | POA: Diagnosis not present

## 2016-05-26 DIAGNOSIS — T8383XA Hemorrhage of genitourinary prosthetic devices, implants and grafts, initial encounter: Secondary | ICD-10-CM | POA: Insufficient documentation

## 2016-05-26 DIAGNOSIS — Z975 Presence of (intrauterine) contraceptive device: Secondary | ICD-10-CM

## 2016-05-26 DIAGNOSIS — F909 Attention-deficit hyperactivity disorder, unspecified type: Secondary | ICD-10-CM | POA: Insufficient documentation

## 2016-05-26 NOTE — ED Notes (Addendum)
Pt reports to ED w/ c/o bleeding from IUD removal site.  Fresh dressing applied by this RN, fresh blood noted to old bandage.  Pts circulation, sensation and motor function intact.  Pt denies CP, SOB, LOC, n/v/d, fever or dizziness.  NAD

## 2016-05-26 NOTE — ED Notes (Addendum)
Pt c/o long wait time to see MD.  Informed pt that MD had several pts brought back at once, but that I would communicate displeasure to MD.  Pt unhappy and left without being revitalized or signing out.  Pt ambulatory, resp even and unlabored, skin tone WNL.  No bleeding noted to fresh bandage.

## 2016-05-26 NOTE — Progress Notes (Signed)
     GYNECOLOGY CLINIC PROCEDURE NOTE  Brandi Watkins is a 20 y.o. G0P0000 here for Nexplanon removal.  No prior pap smear history.  Notes that she is still having breakthrough bleeding with Nexplanon, despite using supplemental estrogen and Ibuprofen.  Desires removal.   Nexplanon Removal Patient identified, informed consent performed, consent signed.   Appropriate time out taken. Nexplanon site identified.  Area prepped in usual sterile fashon. One ml of 1% lidocaine was used to anesthetize the area at the distal end of the implant. A small stab incision was made right beside the implant on the distal portion.  The Nexplanon rod was grasped using hemostats and removed without difficulty.  There was minimal blood loss. There were no complications.  3 ml of 1% lidocaine was injected around the incision for post-procedure analgesia.  Steri-strips were applied over the small incision.  A pressure bandage was applied to reduce any bruising.  The patient tolerated the procedure well and was given post procedure instructions.  Discussion of birth control options had with patient. Patient is planning to use [place]ile[place] IUD for contraception.  Will schedule appointment for insertion.     Rubie Maid, MD [facility]

## 2016-05-26 NOTE — ED Triage Notes (Signed)
Pt to ed with c/o left upper arm bleeding,  Pt states she had birth control removed today and arm started bleeding about 5 pm today.  Small amount of bleeding noted.

## 2016-05-27 ENCOUNTER — Ambulatory Visit (INDEPENDENT_AMBULATORY_CARE_PROVIDER_SITE_OTHER): Payer: Self-pay | Admitting: Obstetrics and Gynecology

## 2016-05-27 ENCOUNTER — Telehealth: Payer: Self-pay | Admitting: Obstetrics and Gynecology

## 2016-05-27 VITALS — BP 136/78 | HR 112 | Ht 62.0 in | Wt 126.4 lb

## 2016-05-27 DIAGNOSIS — T148XXA Other injury of unspecified body region, initial encounter: Secondary | ICD-10-CM

## 2016-05-27 NOTE — Progress Notes (Signed)
    GYNECOLOGY PROGRESS NOTE  Subjective:    Patient ID: Brandi Watkins, female    DOB: 29-Feb-1996, 20 y.o.   MRN: [id number]  HPI  Patient is a 20 y.o. G0P0000 female who presents for complaints of bleeding from previous Nexplanon site.  Patient had Nexplanon removed yesterday.  Notes that after returning to work, she had an episode of heavy bleeding, going down her arm.  Was seen in the Emergency Room yesterday, where minimal bleeding was noted and area was re-dressed (patient only saw nurse, did not stay to see physician).  Notes today that arm is still very sore.  Has not taken anything for pain.   The following portions of the patient's history were reviewed and updated as appropriate: allergies, current medications, past family history, past medical history, past social history, past surgical history and problem list.  Review of Systems Pertinent items noted in HPI and remainder of comprehensive ROS otherwise negative.   Objective:   Blood pressure 136/78, pulse (!) 112, height 5\' 2"  (1.575 m), weight 126 lb 6.4 oz (57.3 kg), last menstrual period 05/26/2016. General appearance: alert and no distress Extremities: Left arm with bandage in place, c/d/i.  Bandage removed with old blood and small clot on steri-strip.  Incision site re-dressed and pressure bandage applied.    Assessment:   Bleeding from Nexplanon incision site  Plan:   No further bleeding noted. Wound re-dressed.  Given Ibuprofen in office today for pain, and advised on taking Ibuprofen q 6 hrs x 24 hrs.  Patient can resume work. To remove pressure bandage tomorrow, and can remove steri-strips in 4-5 days.    Rubie Maid, MD [facility]

## 2016-05-27 NOTE — Telephone Encounter (Signed)
Patient called requesting a note for work today. She stated she had the nexplanon removed yesterday and had to go to the hospital later yesterday because it started bleeding heavily. Thanks

## 2016-05-27 NOTE — Telephone Encounter (Signed)
See previous telephone encounter.

## 2016-05-27 NOTE — Telephone Encounter (Signed)
PT NEEDS A WORK NOTE FOR TODAY AND YESTERDAY. SHE WAS OUT ALL DAY YESTERDAY AND TODAY.

## 2016-05-27 NOTE — Telephone Encounter (Signed)
Please inform pt that we will provide a note for yesterday only, she left the hospital against medical advice without seeing the doctor and the note from the ED says that everything was fine. There is no reason she shouldn't have been able to work today. Thanks

## 2016-06-16 ENCOUNTER — Encounter: Payer: Self-pay | Admitting: Obstetrics and Gynecology

## 2016-06-16 ENCOUNTER — Ambulatory Visit (INDEPENDENT_AMBULATORY_CARE_PROVIDER_SITE_OTHER): Payer: Medicaid Other | Admitting: Obstetrics and Gynecology

## 2016-06-16 VITALS — BP 99/63 | HR 85 | Ht 62.0 in | Wt 124.0 lb

## 2016-06-16 DIAGNOSIS — Z3009 Encounter for other general counseling and advice on contraception: Secondary | ICD-10-CM | POA: Diagnosis not present

## 2016-06-16 DIAGNOSIS — Z3049 Encounter for surveillance of other contraceptives: Secondary | ICD-10-CM | POA: Diagnosis not present

## 2016-06-16 LAB — POCT URINE PREGNANCY: PREG TEST UR: NEGATIVE

## 2016-06-16 NOTE — Progress Notes (Signed)
    GYNECOLOGY PROGRESS NOTE  Subjective:    Patient ID: Brandi Watkins, female    DOB: May 07, 1996, 20 y.o.   MRN: [id number]  HPI  Patient is a 20 y.o. G0P0000 female who initially presented for [place] IUD insertion, however states that she is now having second thoughts.  Desires to discuss other options.  Is leaning toward resuming combined OCPs.  Patient notes being on pills in the past (cannot remember which brand), but notes that she was having some occasional issues with her heart (palpitations, mild chest discomfort).   The following portions of the patient's history were reviewed and updated as appropriate: allergies, current medications, past family history, past medical history, past social history, past surgical history and problem list.  Review of Systems Pertinent items noted in HPI and remainder of comprehensive ROS otherwise negative.   Objective:   Blood pressure 99/63, pulse 85, height 5\' 2"  (1.575 m), weight 124 lb (56.2 kg), last menstrual period 05/26/2016. General appearance: alert and no distress Exam deferred   Assessment:   Contraception counseling  Plan:   Patient now declines IUD.  Discussed all options. Had issues with OCP in the past (cannot recall which brand, did not try alternative dose or brand)/  Will try Nuvaring instead x 1 month.  If she still prefers pills, will try Lo-Loestrin. Patient to start on Sunday after next menses. To call office and notify MD which method she prefers in order to receive prescription.     A total of 15 minutes were spent face-to-face with the patient during this encounter and over half of that time dealt with counseling and coordination of care.   Rubie Maid, MD [facility]

## 2016-09-10 ENCOUNTER — Encounter: Payer: Medicaid Other | Admitting: Obstetrics and Gynecology

## 2016-09-17 ENCOUNTER — Ambulatory Visit (INDEPENDENT_AMBULATORY_CARE_PROVIDER_SITE_OTHER): Payer: Medicaid Other | Admitting: Obstetrics and Gynecology

## 2016-09-17 ENCOUNTER — Encounter: Payer: Self-pay | Admitting: Obstetrics and Gynecology

## 2016-09-17 VITALS — BP 115/71 | HR 93 | Ht 62.0 in | Wt 128.7 lb

## 2016-09-17 DIAGNOSIS — Z30015 Encounter for initial prescription of vaginal ring hormonal contraceptive: Secondary | ICD-10-CM

## 2016-09-17 LAB — POCT URINE PREGNANCY: PREG TEST UR: NEGATIVE

## 2016-09-17 MED ORDER — ETONOGESTREL-ETHINYL ESTRADIOL 0.12-0.015 MG/24HR VA RING: VAGINAL_RING | VAGINAL | 12 refills | Status: DC

## 2016-09-17 NOTE — Patient Instructions (Signed)
Ethinyl Estradiol; Etonogestrel vaginal ring What is this medicine? ETHINYL ESTRADIOL; ETONOGESTREL (ETH in il es tra DYE ole; et oh noe JES trel) vaginal ring is a flexible, vaginal ring used as a contraceptive (birth control method). This medicine combines two types of female hormones, an estrogen and a progestin. This ring is used to prevent ovulation and pregnancy. Each ring is effective for one month. This medicine may be used for other purposes; ask your health care provider or pharmacist if you have questions. COMMON BRAND NAME(S): NuvaRing What should I tell my health care provider before I take this medicine? They need to know if you have or ever had any of these conditions: -abnormal vaginal bleeding -blood vessel disease or blood clots -breast, cervical, endometrial, ovarian, liver, or uterine cancer -diabetes -gallbladder disease -heart disease or recent heart attack -high blood pressure -high cholesterol -kidney disease -liver disease -migraine headaches -stroke -systemic lupus erythematosus (SLE) -tobacco smoker -an unusual or allergic reaction to estrogens, progestins, other medicines, foods, dyes, or preservatives -pregnant or trying to get pregnant -breast-feeding How should I use this medicine? Insert the ring into your vagina as directed. Follow the directions on the prescription label. The ring will remain place for 3 weeks and is then removed for a 1-week break. A new ring is inserted 1 week after the last ring was removed, on the same day of the week. Check often to make sure the ring is still in place, especially before and after sexual intercourse. If the ring was out of the vagina for an unknown amount of time, you may not be protected from pregnancy. Perform a pregnancy test and call your doctor. Do not use more often than directed. A patient package insert for the product will be given with each prescription and refill. Read this sheet carefully each time. The  sheet may change frequently. Contact your pediatrician regarding the use of this medicine in children. Special care may be needed. This medicine has been used in female children who have started having menstrual periods. Overdosage: If you think you have taken too much of this medicine contact a poison control center or emergency room at once. NOTE: This medicine is only for you. Do not share this medicine with others. What if I miss a dose? You will need to replace your vaginal ring once a month as directed. If the ring should slip out, or if you leave it in longer or shorter than you should, contact your health care professional for advice. What may interact with this medicine? Do not take this medicine with the following medication: -dasabuvir; ombitasvir; paritaprevir; ritonavir -ombitasvir; paritaprevir; ritonavir This medicine may also interact with the following medications: -acetaminophen -antibiotics or medicines for infections, especially rifampin, rifabutin, rifapentine, and griseofulvin, and possibly penicillins or tetracyclines -aprepitant -ascorbic acid (vitamin C) -atorvastatin -barbiturate medicines, such as phenobarbital -bosentan -carbamazepine -caffeine -clofibrate -cyclosporine -dantrolene -doxercalciferol -felbamate -grapefruit juice -hydrocortisone -medicines for anxiety or sleeping problems, such as diazepam or temazepam -medicines for diabetes, including pioglitazone -modafinil -mycophenolate -nefazodone -oxcarbazepine -phenytoin -prednisolone -ritonavir or other medicines for HIV infection or AIDS -rosuvastatin -selegiline -soy isoflavones supplements -St. John's wort -tamoxifen or raloxifene -theophylline -thyroid hormones -topiramate -warfarin This list may not describe all possible interactions. Give your health care provider a list of all the medicines, herbs, non-prescription drugs, or dietary supplements you use. Also tell them if you smoke,  drink alcohol, or use illegal drugs. Some items may interact with your medicine. What should I watch for while using   this medicine? Visit your doctor or health care professional for regular checks on your progress. You will need a regular breast and pelvic exam and Pap smear while on this medicine. Use an additional method of contraception during the first cycle that you use this ring. Do not use a diaphragm or female condom, as the ring can interfere with these birth control methods and their proper placement. If you have any reason to think you are pregnant, stop using this medicine right away and contact your doctor or health care professional. If you are using this medicine for hormone related problems, it may take several cycles of use to see improvement in your condition. Smoking increases the risk of getting a blood clot or having a stroke while you are using hormonal birth control, especially if you are more than 21 years old. You are strongly advised not to smoke. This medicine can make your body retain fluid, making your fingers, hands, or ankles swell. Your blood pressure can go up. Contact your doctor or health care professional if you feel you are retaining fluid. This medicine can make you more sensitive to the sun. Keep out of the sun. If you cannot avoid being in the sun, wear protective clothing and use sunscreen. Do not use sun lamps or tanning beds/booths. If you wear contact lenses and notice visual changes, or if the lenses begin to feel uncomfortable, consult your eye care specialist. In some women, tenderness, swelling, or minor bleeding of the gums may occur. Notify your dentist if this happens. Brushing and flossing your teeth regularly may help limit this. See your dentist regularly and inform your dentist of the medicines you are taking. If you are going to have elective surgery, you may need to stop using this medicine before the surgery. Consult your health care professional  for advice. This medicine does not protect you against HIV infection (AIDS) or any other sexually transmitted diseases. What side effects may I notice from receiving this medicine? Side effects that you should report to your doctor or health care professional as soon as possible: -breast tissue changes or discharge -changes in vaginal bleeding during your period or between your periods -chest pain -coughing up blood -dizziness or fainting spells -headaches or migraines -leg, arm or groin pain -severe or sudden headaches -stomach pain (severe) -sudden shortness of breath -sudden loss of coordination, especially on one side of the body -speech problems -symptoms of vaginal infection like itching, irritation or unusual discharge -tenderness in the upper abdomen -vomiting -weakness or numbness in the arms or legs, especially on one side of the body -yellowing of the eyes or skin Side effects that usually do not require medical attention (report to your doctor or health care professional if they continue or are bothersome): -breakthrough bleeding and spotting that continues beyond the 3 initial cycles of pills -breast tenderness -mood changes, anxiety, depression, frustration, anger, or emotional outbursts -increased sensitivity to sun or ultraviolet light -nausea -skin rash, acne, or brown spots on the skin -weight gain (slight) This list may not describe all possible side effects. Call your doctor for medical advice about side effects. You may report side effects to FDA at 1-800-FDA-1088. Where should I keep my medicine? Keep out of the reach of children. Store at room temperature between 15 and 30 degrees C (59 and 86 degrees F) for up to 4 months. The product will expire after 4 months. Protect from light. Throw away any unused medicine after the expiration date. NOTE: This   sheet is a summary. It may not cover all possible information. If you have questions about this medicine, talk  to your doctor, pharmacist, or health care provider.  2018 [employer]/Gold Standard (2016-03-06 17:00:31)  

## 2016-09-18 NOTE — Progress Notes (Signed)
    GYNECOLOGY CLINIC PROGRESS NOTE  Subjective:    Brandi Watkins is a 21 y.o. G0P0 female who presents for contraception counseling. The patient has no complaints today. The patient is sexually active. Pertinent past medical history: none. Denies complaints today. Tried a trial of NuvaRing ~ 2 months ago and liked it.  Notes that she would like to continue with a prescription.   Menstrual History: OB History    Gravida Para Term Preterm AB Living   0 0 0 0 0 0   SAB TAB Ectopic Multiple Live Births   0 0 0 0        Menarche age: 21 Patient's last menstrual period was 08/27/2016 (approximate).    The following portions of the patient's history were reviewed and updated as appropriate: allergies, current medications, past family history, past medical history, past social history, past surgical history and problem list.  Review of Systems Pertinent items noted in HPI and remainder of comprehensive ROS otherwise negative.   Objective:   Blood pressure 115/71, pulse 93, height 5\' 2"  (1.575 m), weight 128 lb 11.2 oz (58.4 kg), last menstrual period 08/27/2016. Gen App: NAD  No exam performed today, not indicated.   Assessment:    21 y.o., desiring to continue NuvaRing vaginal inserts, no contraindications.   Plan:    Contraception: NuvaRing vaginal inserts.  To begin Sunday start after next menstrual cycle. Encouraged to use alternative method (condoms, abstinence) until then. Will f/u after 3 months of use.    Rubie Maid, MD [facility]

## 2016-09-23 ENCOUNTER — Emergency Department
Admission: EM | Admit: 2016-09-23 | Discharge: 2016-09-23 | Disposition: A | Discharge status: Home / Self Care | Payer: Medicaid Other | Attending: Emergency Medicine | Admitting: Emergency Medicine

## 2016-09-23 ENCOUNTER — Encounter: Payer: Self-pay | Admitting: Emergency Medicine

## 2016-09-23 DIAGNOSIS — F17200 Nicotine dependence, unspecified, uncomplicated: Secondary | ICD-10-CM | POA: Insufficient documentation

## 2016-09-23 DIAGNOSIS — F909 Attention-deficit hyperactivity disorder, unspecified type: Secondary | ICD-10-CM | POA: Insufficient documentation

## 2016-09-23 DIAGNOSIS — R51 Headache: Secondary | ICD-10-CM

## 2016-09-23 DIAGNOSIS — R519 Headache, unspecified: Secondary | ICD-10-CM

## 2016-09-23 DIAGNOSIS — J0140 Acute pansinusitis, unspecified: Secondary | ICD-10-CM

## 2016-09-23 MED ORDER — OXYMETAZOLINE HCL 0.05 % NA SOLN: 2.0000 | Freq: Two times a day (BID) | NASAL | 0 refills | Status: DC

## 2016-09-23 MED ORDER — BUTALBITAL-APAP-CAFFEINE 50-325-40 MG PO TABS: 1.0000 | ORAL_TABLET | Freq: Four times a day (QID) | ORAL | 0 refills | Status: DC | PRN

## 2016-09-23 MED ORDER — AMOXICILLIN-POT CLAVULANATE 875-125 MG PO TABS
1.0000 | ORAL_TABLET | Freq: Once | ORAL | Status: AC
  Administered 2016-09-23: 1 via ORAL
  Filled 2016-09-23: qty 1

## 2016-09-23 MED ORDER — AMOXICILLIN-POT CLAVULANATE 875-125 MG PO TABS: 1.0000 | ORAL_TABLET | Freq: Two times a day (BID) | ORAL | 0 refills | Status: AC

## 2016-09-23 NOTE — Discharge Instructions (Signed)
Please take the medications as prescribed. And please follow-up with the acute care clinic. If within the next 2-3 days if symptoms are not feeling better or year feeling any other worsening symptoms of his return to the emergency department for further evaluation.

## 2016-09-23 NOTE — ED Provider Notes (Signed)
[facility] Emergency Department Provider Note   ____________________________________________   First MD Initiated Contact with Patient 09/23/16 [id number]     (approximate)  I have reviewed the triage vital signs and the nursing notes.   HISTORY  Chief Complaint Headache    HPI Brandi Watkins is a 21 y.o. female who comes into the hospital today with a migraine. The patient reports that she has some pain in her head. She's been sick for the last week with some runny nose, congestion, sore throat and mild cough. The patient's mother is concerned about dehydration. She complained that she's had this headache as well for the past week and a half. It is been on and off. The patient is taking Tylenol and ibuprofen but has not helped. Tonight before she came in she tried a [place] powder. The patient also took some migraine medicine that she was given by her on. She denies a history of migraines in the past. Have some facial pain but reports that her headache is a 0 out of 10 in intensity currently. The patient reports that she has been trying to stay hydrated.   Past Medical History:  Diagnosis Date  . ADHD (attention deficit hyperactivity disorder)   . Anemia     There are no active problems to display for this patient.   Past Surgical History:  Procedure Laterality Date  . NONE      Prior to Admission medications   Medication Sig Start Date End Date Taking? Authorizing Provider  amoxicillin-clavulanate (AUGMENTIN) 875-125 MG tablet Take 1 tablet by mouth 2 (two) times daily. 09/23/16 10/03/16  Loney Hering, MD  butalbital-acetaminophen-caffeine (FIORICET, ESGIC) [id number] MG tablet Take 1-2 tablets by mouth every 6 (six) hours as needed for headache. 09/23/16 09/23/17  Loney Hering, MD  etonogestrel-ethinyl estradiol (NUVARING) 0.12-0.015 MG/24HR vaginal ring Insert vaginally and leave in place for 3 consecutive weeks, then remove for 1 week. 09/17/16    Rubie Maid, MD  oxymetazoline (AFRIN) 0.05 % nasal spray Place 2 sprays into both nostrils 2 (two) times daily. 09/23/16 09/23/17  Loney Hering, MD    Allergies Patient has no known allergies.  Family History  Problem Relation Age of Onset  . Diabetes Mother   . Hypertension Mother   . Asthma Maternal Aunt     Social History Social History  Substance Use Topics  . Smoking status: Current Every Day Smoker  . Smokeless tobacco: Never Used  . Alcohol use Yes    Review of Systems Constitutional: No fever/chills Eyes: No visual changes. ENT: Facial pain and scratchy throat Cardiovascular: Denies chest pain. Respiratory: Denies shortness of breath. Gastrointestinal: No abdominal pain.  No nausea, no vomiting.  No diarrhea.  No constipation. Genitourinary: Negative for dysuria. Musculoskeletal: Negative for back pain. Skin: Negative for rash. Neurological: Headache  10-point ROS otherwise negative.  ____________________________________________   PHYSICAL EXAM:  VITAL SIGNS: ED Triage Vitals  Enc Vitals Group     BP 09/23/16 0252 121/86     Pulse Rate 09/23/16 0252 80     Resp 09/23/16 0252 20     Temp 09/23/16 0252 97.7 F (36.5 C)     Temp Source 09/23/16 0252 Oral     SpO2 09/23/16 0252 100 %     Weight 09/23/16 0252 128 lb (58.1 kg)     Height 09/23/16 0252 5\' 2"  (1.575 m)     Head Circumference --      Peak Flow --  Pain Score 09/23/16 0251 10     Pain Loc --      Pain Edu? --      Excl. in [place]? --     Constitutional: Alert and oriented. Well appearing and in Mild distress. Eyes: Conjunctivae are normal. PERRL. EOMI. Head: Atraumatic. Maxillary, ethmoid and frontal sinus tenderness to palpation Nose: No congestion/rhinnorhea. Mouth/Throat: Mucous membranes are moist.  Oropharynx non-erythematous. Cardiovascular: Normal rate, regular rhythm. Grossly normal heart sounds.  Good peripheral circulation. Respiratory: Normal respiratory effort.  No  retractions. Lungs CTAB. Gastrointestinal: Soft and nontender. No distention.  Musculoskeletal: No lower extremity tenderness nor edema.   Neurologic:  Normal speech and language.  Skin:  Skin is warm, dry and intact.  Psychiatric: Mood and affect are normal.   ____________________________________________   LABS (all labs ordered are listed, but only abnormal results are displayed)  Labs Reviewed - No data to display ____________________________________________  EKG  none ____________________________________________  RADIOLOGY  none ____________________________________________   PROCEDURES  Procedure(s) performed: None  Procedures  Critical Care performed: No  ____________________________________________   INITIAL IMPRESSION / ASSESSMENT AND PLAN / ED COURSE  Pertinent labs & imaging results that were available during my care of the patient were reviewed by me and considered in my medical decision making (see chart for details).  This is a 21 year old who comes into the hospital today with some headache as been going on for the past week and a half. The patient is also been having some upper respiratory/sinus symptoms for the past week and a half. The patient's headache is improved at this time. I feel that the patient has some sinusitis causing her symptoms. I will give the patient is Augmentin and I will discharge her with some Afrin and Fioricet.      ____________________________________________   FINAL CLINICAL IMPRESSION(S) / ED DIAGNOSES  Final diagnoses:  Acute nonintractable headache, unspecified headache type  Sinus headache  Acute non-recurrent pansinusitis      NEW MEDICATIONS STARTED DURING THIS VISIT:  New Prescriptions   AMOXICILLIN-CLAVULANATE (AUGMENTIN) 875-125 MG TABLET    Take 1 tablet by mouth 2 (two) times daily.   BUTALBITAL-ACETAMINOPHEN-CAFFEINE (FIORICET, ESGIC) 50-325-40 MG TABLET    Take 1-2 tablets by mouth every 6 (six)  hours as needed for headache.   OXYMETAZOLINE (AFRIN) 0.05 % NASAL SPRAY    Place 2 sprays into both nostrils 2 (two) times daily.     Note:  This document was prepared using Dragon voice recognition software and may include unintentional dictation errors.    Loney Hering, MD 09/23/16 0700

## 2016-09-23 NOTE — ED Triage Notes (Signed)
Patient ambulatory to triage with steady gait, without difficulty or distress noted; pt reports frontal HA; st recent sinus congestion and cough x week

## 2016-11-10 DIAGNOSIS — Z202 Contact with and (suspected) exposure to infections with a predominantly sexual mode of transmission: Secondary | ICD-10-CM

## 2016-11-10 HISTORY — DX: Contact with and (suspected) exposure to infections with a predominantly sexual mode of transmission: Z20.2

## 2016-12-17 ENCOUNTER — Ambulatory Visit (INDEPENDENT_AMBULATORY_CARE_PROVIDER_SITE_OTHER): Payer: Medicaid Other | Admitting: Obstetrics and Gynecology

## 2016-12-17 ENCOUNTER — Encounter: Payer: Self-pay | Admitting: Obstetrics and Gynecology

## 2016-12-17 VITALS — BP 114/69 | HR 86 | Ht 62.0 in | Wt 135.3 lb

## 2016-12-17 DIAGNOSIS — Z0000 Encounter for general adult medical examination without abnormal findings: Secondary | ICD-10-CM | POA: Diagnosis not present

## 2016-12-17 DIAGNOSIS — Z113 Encounter for screening for infections with a predominantly sexual mode of transmission: Secondary | ICD-10-CM | POA: Diagnosis not present

## 2016-12-17 DIAGNOSIS — Z01419 Encounter for gynecological examination (general) (routine) without abnormal findings: Secondary | ICD-10-CM

## 2016-12-17 DIAGNOSIS — Z3009 Encounter for other general counseling and advice on contraception: Secondary | ICD-10-CM

## 2016-12-17 DIAGNOSIS — Z202 Contact with and (suspected) exposure to infections with a predominantly sexual mode of transmission: Secondary | ICD-10-CM

## 2016-12-17 MED ORDER — ETONOGESTREL-ETHINYL ESTRADIOL 0.12-0.015 MG/24HR VA RING: VAGINAL_RING | VAGINAL | 12 refills | Status: DC

## 2016-12-17 NOTE — Patient Instructions (Addendum)
Preventive Care 18-39 Years, Female Preventive care refers to lifestyle choices and visits with your health care provider that can promote health and wellness. What does preventive care include?  A yearly physical exam. This is also called an annual well check.  Dental exams once or twice a year.  Routine eye exams. Ask your health care provider how often you should have your eyes checked.  Personal lifestyle choices, including: ? Daily care of your teeth and gums. ? Regular physical activity. ? Eating a healthy diet. ? Avoiding tobacco and drug use. ? Limiting alcohol use. ? Practicing safe sex. ? Taking vitamin and mineral supplements as recommended by your health care provider. What happens during an annual well check? The services and screenings done by your health care provider during your annual well check will depend on your age, overall health, lifestyle risk factors, and family history of disease. Counseling Your health care provider may ask you questions about your:  Alcohol use.  Tobacco use.  Drug use.  Emotional well-being.  Home and relationship well-being.  Sexual activity.  Eating habits.  Work and work Statistician.  Method of birth control.  Menstrual cycle.  Pregnancy history.  Screening You may have the following tests or measurements:  Height, weight, and BMI.  Diabetes screening. This is done by checking your blood sugar (glucose) after you have not eaten for a while (fasting).  Blood pressure.  Lipid and cholesterol levels. These may be checked every 5 years starting at age 38.  Skin check.  Hepatitis C blood test.  Hepatitis B blood test.  Sexually transmitted disease (STD) testing.  BRCA-related cancer screening. This may be done if you have a family history of breast, ovarian, tubal, or peritoneal cancers.  Pelvic exam and Pap test. This may be done every 3 years starting at age 38. Starting at age 30, this may be done  every 5 years if you have a Pap test in combination with an HPV test.  Discuss your test results, treatment options, and if necessary, the need for more tests with your health care provider. Vaccines Your health care provider may recommend certain vaccines, such as:  Influenza vaccine. This is recommended every year.  Tetanus, diphtheria, and acellular pertussis (Tdap, Td) vaccine. You may need a Td booster every 10 years.  Varicella vaccine. You may need this if you have not been vaccinated.  HPV vaccine. If you are 39 or younger, you may need three doses over 6 months.  Measles, mumps, and rubella (MMR) vaccine. You may need at least one dose of MMR. You may also need a second dose.  Pneumococcal 13-valent conjugate (PCV13) vaccine. You may need this if you have certain conditions and were not previously vaccinated.  Pneumococcal polysaccharide (PPSV23) vaccine. You may need one or two doses if you smoke cigarettes or if you have certain conditions.  Meningococcal vaccine. One dose is recommended if you are age 68-21 years and a first-year college student living in a residence hall, or if you have one of several medical conditions. You may also need additional booster doses.  Hepatitis A vaccine. You may need this if you have certain conditions or if you travel or work in places where you may be exposed to hepatitis A.  Hepatitis B vaccine. You may need this if you have certain conditions or if you travel or work in places where you may be exposed to hepatitis B.  Haemophilus influenzae type b (Hib) vaccine. You may need this  you have certain risk factors.  Talk to your health care provider about which screenings and vaccines you need and how often you need them. This information is not intended to replace advice given to you by your health care provider. Make sure you discuss any questions you have with your health care provider. Document Released: 08/25/2001 Document Revised: 03/18/2016  Document Reviewed: 04/30/2015 Elsevier Interactive Patient Education  2017 Elsevier Inc. HPV (Human Papillomavirus) Vaccine: What You Need to Know 1. Why get vaccinated? HPV vaccine prevents infection with human papillomavirus (HPV) types that are associated with many cancers, including:  cervical cancer in females,  vaginal and vulvar cancers in females,  anal cancer in females and males,  throat cancer in females and males, and  penile cancer in males.  In addition, HPV vaccine prevents infection with HPV types that cause genital warts in both females and males. In the [place] about 12,000 women get cervical cancer every year, and about 4,000 women die from it. HPV vaccine can prevent most of these cases of cervical cancer. Vaccination is not a substitute for cervical cancer screening. This vaccine does not protect against all HPV types that can cause cervical cancer. Women should still get regular Pap tests. HPV infection usually comes from sexual contact, and most people will become infected at some point in their life. About 14 million Americans, including teens, get infected every year. Most infections will go away on their own and not cause serious problems. But thousands of women and men get cancer and other diseases from HPV. 2. HPV vaccine HPV vaccine is approved by FDA and is recommended by CDC for both males and females. It is routinely given at 11 or 21 years of age, but it may be given beginning at age 9 years through age 26 years. Most adolescents 9 through 21 years of age should get HPV vaccine as a two-dose series with the doses separated by 6-12 months. People who start HPV vaccination at 15 years of age and older should get the vaccine as a three-dose series with the second dose given 1-2 months after the first dose and the third dose given 6 months after the first dose. There are several exceptions to these age recommendations. Your health care provider can give you more  information. 3. Some people should not get this vaccine  Anyone who has had a severe (life-threatening) allergic reaction to a dose of HPV vaccine should not get another dose.  Anyone who has a severe (life threatening) allergy to any component of HPV vaccine should not get the vaccine.  Tell your doctor if you have any severe allergies that you know of, including a severe allergy to yeast.  HPV vaccine is not recommended for pregnant women. If you learn that you were pregnant when you were vaccinated, there is no reason to expect any problems for you or your baby. Any woman who learns she was pregnant when she got HPV vaccine is encouraged to contact the manufacturer's registry for HPV vaccination during pregnancy at 1-800-986-8999. Women who are breastfeeding may be vaccinated.  If you have a mild illness, such as a cold, you can probably get the vaccine today. If you are moderately or severely ill, you should probably wait until you recover. Your doctor can advise you. 4. Risks of a vaccine reaction With any medicine, including vaccines, there is a chance of side effects. These are usually mild and go away on their own, but serious reactions are also possible. Most   people who get HPV vaccine do not have any serious problems with it. Mild or moderate problems following HPV vaccine:  Reactions in the arm where the shot was given: ? Soreness (about 9 people in 10) ? Redness or swelling (about 1 person in 3)  Fever: ? Mild (100F) (about 1 person in 10) ? Moderate (102F) (about 1 person in 65)  Other problems: ? Headache (about 1 person in 3) Problems that could happen after any injected vaccine:  People sometimes faint after a medical procedure, including vaccination. Sitting or lying down for about 15 minutes can help prevent fainting, and injuries caused by a fall. Tell your doctor if you feel dizzy, or have vision changes or ringing in the ears.  Some people get severe pain in the  shoulder and have difficulty moving the arm where a shot was given. This happens very rarely.  Any medication can cause a severe allergic reaction. Such reactions from a vaccine are very rare, estimated at about 1 in a million doses, and would happen within a few minutes to a few hours after the vaccination. As with any medicine, there is a very remote chance of a vaccine causing a serious injury or death. The safety of vaccines is always being monitored. For more information, visit: www.cdc.gov/vaccinesafety/. 5. What if there is a serious reaction? What should I look for? Look for anything that concerns you, such as signs of a severe allergic reaction, very high fever, or unusual behavior. Signs of a severe allergic reaction can include hives, swelling of the face and throat, difficulty breathing, a fast heartbeat, dizziness, and weakness. These would usually start a few minutes to a few hours after the vaccination. What should I do? If you think it is a severe allergic reaction or other emergency that can't wait, call 9-1-1 or get to the nearest hospital. Otherwise, call your doctor. Afterward, the reaction should be reported to the Vaccine Adverse Event Reporting System (VAERS). Your doctor should file this report, or you can do it yourself through the VAERS web site at www.vaers.hhs.gov, or by calling 1-800-822-7967. VAERS does not give medical advice. 6. The National Vaccine Injury Compensation Program The National Vaccine Injury Compensation Program (VICP) is a federal program that was created to compensate people who may have been injured by certain vaccines. Persons who believe they may have been injured by a vaccine can learn about the program and about filing a claim by calling 1-800-338-2382 or visiting the VICP website at www.hrsa.gov/vaccinecompensation. There is a time limit to file a claim for compensation. 7. How can I learn more?  Ask your health care provider. He or she can give  you the vaccine package insert or suggest other sources of information.  Call your local or state health department.  Contact the Centers for Disease Control and Prevention (CDC): ? Call 1-800-232-4636 (1-800-CDC-INFO) or ? Visit CDC's website at www.cdc.gov/hpv Vaccine Information Statement, HPV Vaccine (06/14/2015) This information is not intended to replace advice given to you by your health care provider. Make sure you discuss any questions you have with your health care provider. Document Released: 01/24/2014 Document Revised: 03/19/2016 Document Reviewed: 03/19/2016 Elsevier Interactive Patient Education  2017 Elsevier Inc.  

## 2016-12-17 NOTE — Progress Notes (Signed)
GYNECOLOGY ANNUAL PHYSICAL EXAM PROGRESS NOTE  Subjective:    Brandi Watkins is a 21 y.o. [place] female who presents for an annual exam. The patient has no complaints today. The patient is sexually active. The patient wears seatbelts: yes. The patient participates in regular exercise: yes. Has the patient ever been transfused or tattooed?: no. The patient reports that there is not domestic violence in her life.    Gynecologic History:  Menarche age: 61 Patient's last menstrual period was 12/06/2016. Period Cycle (Days): 28 Period Duration (Days): 5-7 Period Pattern: Regular Menstrual Flow: Moderate Dysmenorrhea: None  Contraception: condoms (sporadic) History of STI's: Recently treated for Chlamydia 3 weeks ago Last Pap: no prior pap history.   Obstetric History   G0   P0   T0   P0   A0   L0    SAB0   TAB0   Ectopic0   Multiple0   Live Births0       Past Medical History:  Diagnosis Date  . ADHD (attention deficit hyperactivity disorder)   . Anemia   . Chlamydia contact, treated 11/2016    Past Surgical History:  Procedure Laterality Date  . NONE      Family History  Problem Relation Age of Onset  . Diabetes Mother   . Hypertension Mother   . Asthma Maternal Aunt     Social History   Social History  . Marital status: Single    Spouse name: N/A  . Number of children: N/A  . Years of education: N/A   Occupational History  . Not on file.   Social History Main Topics  . Smoking status: Never Smoker  . Smokeless tobacco: Never Used  . Alcohol use Yes  . Drug use: Yes    Types: Marijuana     Comment: Daily   . Sexual activity: Yes    Birth control/ protection: Condom   Other Topics Concern  . Not on file   Social History Narrative  . No narrative on file    No current outpatient prescriptions on file prior to visit.   No current facility-administered medications on file prior to visit.     No Known Allergies    Review of  Systems Constitutional: negative for chills, fatigue, fevers and sweats Eyes: negative for irritation, redness and visual disturbance Ears, nose, mouth, throat, and face: negative for hearing loss, nasal congestion, snoring and tinnitus Respiratory: negative for asthma, cough, sputum Cardiovascular: negative for chest pain, dyspnea, exertional chest pressure/discomfort, irregular heart beat, palpitations and syncope Gastrointestinal: negative for abdominal pain, change in bowel habits, nausea and vomiting Genitourinary: negative for abnormal menstrual periods, genital lesions, sexual problems and vaginal discharge, dysuria and urinary incontinence Integument/breast: negative for breast lump, breast tenderness and nipple discharge Hematologic/lymphatic: negative for bleeding and easy bruising Musculoskeletal:negative for back pain and muscle weakness Neurological: negative for dizziness, headaches, vertigo and weakness Endocrine: negative for diabetic symptoms including polydipsia, polyuria and skin dryness Allergic/Immunologic: negative for hay fever and urticaria        Objective:  Blood pressure 114/69, pulse 86, height 5\' 2"  (1.575 m), weight 135 lb 4.8 oz (61.4 kg), last menstrual period 12/06/2016. Body mass index is 24.75 kg/m.  General Appearance:    Alert, cooperative, no distress, appears stated age  Head:    Normocephalic, without obvious abnormality, atraumatic  Eyes:    PERRL, conjunctiva/corneas clear, EOM's intact, both eyes  Ears:    Normal external ear canals, both ears  Nose:  Nares normal, septum midline, mucosa normal, no drainage or sinus tenderness  Throat:   Lips, mucosa, and tongue normal; teeth and gums normal  Neck:   Supple, symmetrical, trachea midline, no adenopathy; thyroid: no enlargement/tenderness/nodules; no carotid bruit or JVD  Back:     Symmetric, no curvature, ROM normal, no CVA tenderness  Lungs:     Clear to auscultation bilaterally, respirations  unlabored  Chest Wall:    No tenderness or deformity   Heart:    Regular rate and rhythm, S1 and S2 normal, no murmur, rub or gallop  Breast Exam:    No tenderness, masses, or nipple abnormality  Abdomen:     Soft, non-tender, bowel sounds active all four quadrants, no masses, no organomegaly.    Genitalia:    Pelvic:external genitalia normal, vagina without lesions, discharge, or tenderness, rectovaginal septum  normal. Cervix normal in appearance, no cervical motion tenderness, no adnexal masses or tenderness.  Uterus normal size, shape, mobile, regular contours, nontender.  Rectal:    Normal external sphincter.  No hemorrhoids appreciated. Internal exam not done.   Extremities:   Extremities normal, atraumatic, no cyanosis or edema  Pulses:   2+ and symmetric all extremities  Skin:   Skin color, texture, turgor normal, no rashes or lesions  Lymph nodes:   Cervical, supraclavicular, and axillary nodes normal  Neurologic:   CNII-XII intact, normal strength, sensation and reflexes throughout    Labs:  Lab Results  Component Value Date   WBC 8.9 08/23/2013   HGB 13.0 09/17/2015   HCT 40.2 07/30/2015   MCV 94 08/23/2013   PLT 255 08/23/2013    Lab Results  Component Value Date   CREATININE 0.72 08/23/2013   BUN 10 08/23/2013   NA 139 08/23/2013   K 3.9 08/23/2013   CL 107 08/23/2013   CO2 30 (H) 08/23/2013     Assessment:    Healthy female exam.   Recent Chlamydia infection  Contraception counseling  Plan:     Blood tests: CBC with diff and Comprehensive metabolic panel. Breast self exam technique reviewed and patient encouraged to perform self-exam monthly. Contraception: condoms (sporadic use). Counseled on contraception options. Patient will go back to using NuvaRing, but will set an alarm (previously discontinued due to forgetfulness).  Discussed healthy lifestyle modifications.    Cervical cancer screening to begin at age 74.  STD screening performed today in light  of patient's recent diagnosis of chlamydia..  Discussed Gardasil vaccine.  Follow up in 1 year.    Rubie Maid, MD [facility]

## 2016-12-18 LAB — RPR: RPR Ser Ql: NONREACTIVE

## 2016-12-18 LAB — COMPREHENSIVE METABOLIC PANEL
ALBUMIN: 4.8 g/dL (ref 3.5–5.5)
ALK PHOS: 55 IU/L (ref 39–117)
ALT: 39 IU/L — ABNORMAL HIGH (ref 0–32)
AST: 31 IU/L (ref 0–40)
Albumin/Globulin Ratio: 1.5 (ref 1.2–2.2)
BILIRUBIN TOTAL: 0.5 mg/dL (ref 0.0–1.2)
BUN / CREAT RATIO: 15 (ref 9–23)
BUN: 9 mg/dL (ref 6–20)
CHLORIDE: 99 mmol/L (ref 96–106)
CO2: 24 mmol/L (ref 18–29)
Calcium: 10 mg/dL (ref 8.7–10.2)
Creatinine, Ser: 0.59 mg/dL (ref 0.57–1.00)
GFR calc Af Amer: 153 mL/min/{1.73_m2} (ref >59)
GFR calc non Af Amer: 132 mL/min/{1.73_m2} (ref >59)
GLUCOSE: 87 mg/dL (ref 65–99)
Globulin, Total: 3.2 g/dL (ref 1.5–4.5)
POTASSIUM: 4.6 mmol/L (ref 3.5–5.2)
Sodium: 139 mmol/L (ref 134–144)
Total Protein: 8 g/dL (ref 6.0–8.5)

## 2016-12-18 LAB — CBC
Hematocrit: 42.4 % (ref 34.0–46.6)
Hemoglobin: 13.9 g/dL (ref 11.1–15.9)
MCH: 30.9 pg (ref 26.6–33.0)
MCHC: 32.8 g/dL (ref 31.5–35.7)
MCV: 94 fL (ref 79–97)
PLATELETS: 274 10*3/uL (ref 150–379)
RBC: 4.5 x10E6/uL (ref 3.77–5.28)
RDW: 13.4 % (ref 12.3–15.4)
WBC: 9.8 10*3/uL (ref 3.4–10.8)

## 2016-12-18 LAB — HEPATITIS B SURFACE ANTIGEN: Hepatitis B Surface Ag: NEGATIVE

## 2016-12-18 LAB — HIV ANTIBODY (ROUTINE TESTING W REFLEX): HIV SCREEN 4TH GENERATION: NONREACTIVE

## 2016-12-19 LAB — GC/CHLAMYDIA PROBE AMP
CHLAMYDIA, DNA PROBE: NEGATIVE
NEISSERIA GONORRHOEAE BY PCR: NEGATIVE

## 2017-05-03 ENCOUNTER — Encounter: Payer: Self-pay | Admitting: Obstetrics and Gynecology

## 2017-05-03 ENCOUNTER — Ambulatory Visit (INDEPENDENT_AMBULATORY_CARE_PROVIDER_SITE_OTHER): Payer: Self-pay | Admitting: Obstetrics and Gynecology

## 2017-05-03 VITALS — BP 119/73 | HR 98 | Ht 62.0 in | Wt 133.9 lb

## 2017-05-03 DIAGNOSIS — N3001 Acute cystitis with hematuria: Secondary | ICD-10-CM

## 2017-05-03 DIAGNOSIS — R350 Frequency of micturition: Secondary | ICD-10-CM

## 2017-05-03 LAB — POCT URINALYSIS DIPSTICK
Bilirubin, UA: NEGATIVE
Glucose, UA: NEGATIVE
Ketones, UA: NEGATIVE
Nitrite, UA: NEGATIVE
Spec Grav, UA: 1.01 (ref 1.010–1.025)
Urobilinogen, UA: 0.2 E.U./dL
pH, UA: 8 (ref 5.0–8.0)

## 2017-05-03 MED ORDER — NITROFURANTOIN MONOHYD MACRO 100 MG PO CAPS: 100.0000 mg | ORAL_CAPSULE | Freq: Two times a day (BID) | ORAL | 1 refills | Status: DC

## 2017-05-03 NOTE — Progress Notes (Signed)
HPI:      [name] is a 21 y.o. G0P0000 who LMP was Patient's last menstrual period was 04/20/2017.  Subjective:   She presents today with complaint of urinary urgency and frequency.  She states that she is using NuvaRing for birth control.  She had chlamydia within the last year but says that this is not like those symptoms.  Has not had a urinary tract infection for many years.    Hx: The following portions of the patient's history were reviewed and updated as appropriate:             She  has a past medical history of ADHD (attention deficit hyperactivity disorder); Anemia; and Chlamydia contact, treated (11/2016). She  does not have a problem list on file. She  has a past surgical history that includes NONE. Her family history includes Asthma in her maternal aunt; Diabetes in her mother; Hypertension in her mother. She  reports that she has never smoked. She has never used smokeless tobacco. She reports that she drinks alcohol. She reports that she uses drugs, including Marijuana. She has No Known Allergies.       Review of Systems:  Review of Systems  Constitutional: Denied constitutional symptoms, night sweats, recent illness, fatigue, fever, insomnia and weight loss.  Eyes: Denied eye symptoms, eye pain, photophobia, vision change and visual disturbance.  Ears/Nose/Throat/Neck: Denied ear, nose, throat or neck symptoms, hearing loss, nasal discharge, sinus congestion and sore throat.  Cardiovascular: Denied cardiovascular symptoms, arrhythmia, chest pain/pressure, edema, exercise intolerance, orthopnea and palpitations.  Respiratory: Denied pulmonary symptoms, asthma, pleuritic pain, productive sputum, cough, dyspnea and wheezing.  Gastrointestinal: Denied, gastro-esophageal reflux, melena, nausea and vomiting.  Genitourinary: See HPI for additional information.  Musculoskeletal: Denied musculoskeletal symptoms, stiffness, swelling, muscle weakness and myalgia.   Dermatologic: Denied dermatology symptoms, rash and scar.  Neurologic: Denied neurology symptoms, dizziness, headache, neck pain and syncope.  Psychiatric: Denied psychiatric symptoms, anxiety and depression.  Endocrine: Denied endocrine symptoms including hot flashes and night sweats.   Meds:   Current Outpatient Prescriptions on File Prior to Visit  Medication Sig Dispense Refill  . etonogestrel-ethinyl estradiol (NUVARING) 0.12-0.015 MG/24HR vaginal ring Insert vaginally and leave in place for 3 consecutive weeks, then remove for 1 week. 1 each 12   No current facility-administered medications on file prior to visit.     Objective:     Vitals:   05/03/17 1542  BP: 119/73  Pulse: 98              UA consistent with UTI showing leukocytes and blood.  Assessment:    G0P0000 There are no active problems to display for this patient.    1. Urinary frequency   2. Acute cystitis with hematuria        Plan:            1.  Macrobid  2.  Discussed GC and Chlamydia testing-patient declined   3.  Urine for C&S Orders Orders Placed This Encounter  Procedures  . Urine Culture  . POCT urinalysis dipstick     Meds ordered this encounter  Medications  . nitrofurantoin, macrocrystal-monohydrate, (MACROBID) 100 MG capsule    Sig: Take 1 capsule (100 mg total) by mouth 2 (two) times daily.    Dispense:  14 capsule    Refill:  1      F/U  No Follow-up on file. I spent 15 minutes with this patient of which greater than 50% was spent  discussing urinary tract infection.  Treated with antibiotics.  Chlamydial infection, test of cure, treatment of partners.  Finis Bud, M.D. 05/03/2017 4:12 PM

## 2017-05-05 LAB — URINE CULTURE

## 2017-12-23 ENCOUNTER — Encounter: Payer: Medicaid Other | Admitting: Obstetrics and Gynecology

## 2018-10-25 LAB — HM HEPATITIS C SCREENING LAB: HM Hepatitis Screen: NEGATIVE

## 2018-10-25 LAB — HM HIV SCREENING LAB: HM HIV Screening: NEGATIVE

## 2018-11-23 ENCOUNTER — Ambulatory Visit (INDEPENDENT_AMBULATORY_CARE_PROVIDER_SITE_OTHER): Payer: Medicaid Other | Admitting: Obstetrics and Gynecology

## 2018-11-23 ENCOUNTER — Other Ambulatory Visit: Payer: Self-pay

## 2018-11-23 ENCOUNTER — Encounter: Payer: Self-pay | Admitting: Obstetrics and Gynecology

## 2018-11-23 VITALS — BP 127/86 | HR 92 | Ht 62.5 in | Wt 159.6 lb

## 2018-11-23 DIAGNOSIS — O26899 Other specified pregnancy related conditions, unspecified trimester: Secondary | ICD-10-CM

## 2018-11-23 DIAGNOSIS — R109 Unspecified abdominal pain: Secondary | ICD-10-CM

## 2018-11-23 DIAGNOSIS — Z8759 Personal history of other complications of pregnancy, childbirth and the puerperium: Secondary | ICD-10-CM

## 2018-11-23 DIAGNOSIS — Z3201 Encounter for pregnancy test, result positive: Secondary | ICD-10-CM

## 2018-11-23 DIAGNOSIS — R110 Nausea: Secondary | ICD-10-CM

## 2018-11-23 LAB — POCT URINALYSIS DIPSTICK
Bilirubin, UA: NEGATIVE
Blood, UA: NEGATIVE
Glucose, UA: NEGATIVE
Ketones, UA: NEGATIVE
Leukocytes, UA: NEGATIVE
Nitrite, UA: NEGATIVE
Protein, UA: NEGATIVE
Spec Grav, UA: >=1.03 — AB (ref 1.010–1.025)
Urobilinogen, UA: 0.2 E.U./dL
pH, UA: 6 (ref 5.0–8.0)

## 2018-11-23 MED ORDER — VITAMIN B-6 50 MG PO TABS: 50.0000 mg | ORAL_TABLET | Freq: Three times a day (TID) | ORAL | 0 refills | Status: DC | PRN

## 2018-11-23 NOTE — Progress Notes (Signed)
   GYNECOLOGY CLINIC PROGRESS NOTE Subjective:    Brandi Watkins is a 23 y.o. female who presents for evaluation of amenorrhea. She believes she could be pregnant. Pregnancy is desired.  Sexual Activity: single partner, contraception: none. Current symptoms also include: nausea, positive home pregnancy test and back pain and abdominal pain (more left sided). Denies pain/burning with urination, denies bleeding currently. .    Patient's last menstrual period was 10/16/2018.  Of note, she reports that she discovered that she was pregnant last month but was having some light bleeding. [facility] and noted that she was probably miscarrying as her hormone levels were declining (reports last hormone level she was told was 10). Denies any passage of blood clots or tissue products.  States she took a home pregnancy test as she has not had a cycle yet this month and was noted to be positive.  Did not have any further bleeding after miscarriage.    The following portions of the patient's history were reviewed and updated as appropriate: allergies, current medications, past family history, past medical history, past social history, past surgical history and problem list.  Review of Systems Pertinent items noted in HPI and remainder of comprehensive ROS otherwise negative.     Objective:    BP 127/86   Pulse 92   Ht 5' 2.5" (1.588 m)   Wt 159 lb 9.6 oz (72.4 kg)   LMP 10/16/2018   BMI 28.73 kg/m  General: alert, no distress and no acute distress   Abdomen: Soft, mildly tender in RLQ and middle abdomen. Bowel sounds normal, no masses.   Pelvis:  deferred   Lab Review Urine HCG: positive     Results for orders placed or performed in visit on 11/23/18  POCT urinalysis dipstick  Result Value Ref Range   Color, UA yellow    Clarity, UA clear    Glucose, UA Negative Negative   Bilirubin, UA neg    Ketones, UA neg    Spec Grav, UA >=1.030 (A) 1.010 - 1.025    Blood, UA neg    pH, UA 6.0 5.0 - 8.0   Protein, UA Negative Negative   Urobilinogen, UA 0.2 0.2 or 1.0 E.U./dL   Nitrite, UA neg    Leukocytes, UA Negative Negative   Appearance yellow    Odor      Assessment:   History of miscarriage  Encounter for pregnancy test, result positive Abdominal pain affecting pregnancy Nausea in pregnancy.   Plan:   - Pregnancy Test: Positive: EDC unknown as patient with recent pregnancy with suspected miscarriage with new pregnancy without another menstrual cycle.  Briefly discussed pre-natal care options. First trimester pregnancy info given. Encouraged well-balanced diet, plenty of rest when needed, pre-natal vitamins daily and walking for exercise. Discussed self-help for nausea, avoiding OTC medications until consulting provider or pharmacist, other than Tylenol as needed, minimal caffeine (1-2 cups daily) and avoiding alcohol.  Will order serial BHCG levels to assess current pregnancy.  - Patient with abdominal pain in pregnancy, cannot r/o ectopic pregnancy.  Labs ordered. Given precautions. UA negative, no evidence of UTI, not source of abdominal pain.  - Will get records [facility].  - Prescribed Vitamin B6 for nausea.  - RTC in 2 days to f/u hormone levels.     Rubie Maid, MD [facility]

## 2018-11-23 NOTE — Progress Notes (Signed)
PT is present today for confirmation of pregnancy. Pt LMP 10/21/18. UPT done today results were positive. Pt stated that she is doing well no complaints. Pt recently had a miscarriage last month.

## 2018-11-23 NOTE — Patient Instructions (Addendum)
First Trimester of Pregnancy  The first trimester of pregnancy is from week 1 until the end of week 13 (months 1 through 3). During this time, your baby will begin to develop inside you. At 6-8 weeks, the eyes and face are formed, and the heartbeat can be seen on ultrasound. At the end of 12 weeks, all the baby's organs are formed. Prenatal care is all the medical care you receive before the birth of your baby. Make sure you get good prenatal care and follow all of your doctor's instructions. Follow these instructions at home: Medicines  Take over-the-counter and prescription medicines only as told by your doctor. Some medicines are safe and some medicines are not safe during pregnancy.  Take a prenatal vitamin that contains at least 600 micrograms (mcg) of folic acid.  If you have trouble pooping (constipation), take medicine that will make your stool soft (stool softener) if your doctor approves. Eating and drinking   Eat regular, healthy meals.  Your doctor will tell you the amount of weight gain that is right for you.  Avoid raw meat and uncooked cheese.  If you feel sick to your stomach (nauseous) or throw up (vomit): ? Eat 4 or 5 small meals a day instead of 3 large meals. ? Try eating a few soda crackers. ? Drink liquids between meals instead of during meals.  To prevent constipation: ? Eat foods that are high in fiber, like fresh fruits and vegetables, whole grains, and beans. ? Drink enough fluids to keep your pee (urine) clear or pale yellow. Activity  Exercise only as told by your doctor. Stop exercising if you have cramps or pain in your lower belly (abdomen) or low back.  Do not exercise if it is too hot, too humid, or if you are in a place of great height (high altitude).  Try to avoid standing for long periods of time. Move your legs often if you must stand in one place for a long time.  Avoid heavy lifting.  Wear low-heeled shoes. Sit and stand up straight.   You can have sex unless your doctor tells you not to. Relieving pain and discomfort  Wear a good support bra if your breasts are sore.  Take warm water baths (sitz baths) to soothe pain or discomfort caused by hemorrhoids. Use hemorrhoid cream if your doctor says it is okay.  Rest with your legs raised if you have leg cramps or low back pain.  If you have puffy, bulging veins (varicose veins) in your legs: ? Wear support hose or compression stockings as told by your doctor. ? Raise (elevate) your feet for 15 minutes, 3-4 times a day. ? Limit salt in your food. Prenatal care  Schedule your prenatal visits by the twelfth week of pregnancy.  Write down your questions. Take them to your prenatal visits.  Keep all your prenatal visits as told by your doctor. This is important. Safety  Wear your seat belt at all times when driving.  Make a list of emergency phone numbers. The list should include numbers for family, friends, the hospital, and police and fire departments. General instructions  Ask your doctor for a referral to a local prenatal class. Begin classes no later than at the start of month 6 of your pregnancy.  Ask for help if you need counseling or if you need help with nutrition. Your doctor can give you advice or tell you where to go for help.  Do not use hot tubs, steam   rooms, or saunas.  Do not douche or use tampons or scented sanitary pads.  Do not cross your legs for long periods of time.  Avoid all herbs and alcohol. Avoid drugs that are not approved by your doctor.  Do not use any tobacco products, including cigarettes, chewing tobacco, and electronic cigarettes. If you need help quitting, ask your doctor. You may get counseling or other support to help you quit.  Avoid cat litter boxes and soil used by cats. These carry germs that can cause birth defects in the baby and can cause a loss of your baby (miscarriage) or stillbirth.  Visit your dentist. At home,  brush your teeth with a soft toothbrush. Be gentle when you floss. Contact a doctor if:  You are dizzy.  You have mild cramps or pressure in your lower belly.  You have a nagging pain in your belly area.  You continue to feel sick to your stomach, you throw up, or you have watery poop (diarrhea).  You have a bad smelling fluid coming from your vagina.  You have pain when you pee (urinate).  You have increased puffiness (swelling) in your face, hands, legs, or ankles. Get help right away if:  You have a fever.  You are leaking fluid from your vagina.  You have spotting or bleeding from your vagina.  You have very bad belly cramping or pain.  You gain or lose weight rapidly.  You throw up blood. It may look like coffee grounds.  You are around people who have [place] measles, fifth disease, or chickenpox.  You have a very bad headache.  You have shortness of breath.  You have any kind of trauma, such as from a fall or a car accident. Summary  The first trimester of pregnancy is from week 1 until the end of week 13 (months 1 through 3).  To take care of yourself and your unborn baby, you will need to eat healthy meals, take medicines only if your doctor tells you to do so, and do activities that are safe for you and your baby.  Keep all follow-up visits as told by your doctor. This is important as your doctor will have to ensure that your baby is healthy and growing well. This information is not intended to replace advice given to you by your health care provider. Make sure you discuss any questions you have with your health care provider. Document Released: 12/16/2007 Document Revised: 07/07/2016 Document Reviewed: 07/07/2016 Elsevier Interactive Patient Education  2019 [employer]  Abdominal Pain During Pregnancy  Belly (abdominal) pain is common during pregnancy. There are many possible causes. Most of the time, it is not a serious problem. Other times, it can be a  sign that something is wrong with the pregnancy. Always tell your doctor if you have belly pain. Follow these instructions at home:  Do not have sex or put anything in your vagina until your pain goes away completely.  Get plenty of rest until your pain gets better.  Drink enough fluid to keep your pee (urine) pale yellow.  Take over-the-counter and prescription medicines only as told by your doctor.  Keep all follow-up visits as told by your doctor. This is important. Contact a doctor if:  Your pain continues or gets worse after resting.  You have lower belly pain that: ? Comes and goes at regular times. ? Spreads to your back. ? Feels like menstrual cramps.  You have pain or burning when you pee (urinate).  Get help right away if:  You have a fever or chills.  You have vaginal bleeding.  You are leaking fluid from your vagina.  You are passing tissue from your vagina.  You throw up (vomit) for more than 24 hours.  You have watery poop (diarrhea) for more than 24 hours.  Your baby is moving less than usual.  You feel very weak or faint.  You have shortness of breath.  You have very bad pain in your upper belly. Summary  Belly (abdominal) pain is common during pregnancy. There are many possible causes.  If you have belly pain during pregnancy, tell your doctor right away.  Keep all follow-up visits as told by your doctor. This is important. This information is not intended to replace advice given to you by your health care provider. Make sure you discuss any questions you have with your health care provider. Document Released: 06/17/2009 Document Revised: 10/01/2016 Document Reviewed: 10/01/2016 Elsevier Interactive Patient Education  2019 [employer].

## 2018-11-24 LAB — BETA HCG QUANT (REF LAB): hCG Quant: 1801 m[IU]/mL

## 2018-11-25 ENCOUNTER — Other Ambulatory Visit: Payer: Medicaid Other

## 2018-11-25 ENCOUNTER — Other Ambulatory Visit: Payer: Self-pay

## 2018-11-25 DIAGNOSIS — Z8759 Personal history of other complications of pregnancy, childbirth and the puerperium: Secondary | ICD-10-CM

## 2018-11-25 DIAGNOSIS — O26899 Other specified pregnancy related conditions, unspecified trimester: Secondary | ICD-10-CM

## 2018-11-25 DIAGNOSIS — Z3201 Encounter for pregnancy test, result positive: Secondary | ICD-10-CM

## 2018-11-26 LAB — BETA HCG QUANT (REF LAB): hCG Quant: 4496 m[IU]/mL

## 2018-11-28 ENCOUNTER — Telehealth: Payer: Self-pay | Admitting: Obstetrics and Gynecology

## 2018-11-28 NOTE — Telephone Encounter (Signed)
Notified patient of labs. Please see when Dr. Marcelline Mates would like her to have [place]

## 2018-11-28 NOTE — Telephone Encounter (Signed)
The patient called and stated that she is checking the status of her results and would like a call back if possible. Please advise.

## 2018-11-29 NOTE — Telephone Encounter (Signed)
Please result notes encounter.

## 2018-11-29 NOTE — Telephone Encounter (Signed)
Sometime this week or next week will be fine.

## 2018-12-02 ENCOUNTER — Telehealth: Payer: Self-pay

## 2018-12-02 ENCOUNTER — Telehealth: Payer: Self-pay | Admitting: Obstetrics and Gynecology

## 2018-12-02 NOTE — Telephone Encounter (Signed)
Patient called stating she had some light spotting the other day but it has since stopped. She is scheduled for dating and viability on 5/26. She would like a call back.Thanks

## 2018-12-02 NOTE — Telephone Encounter (Signed)
Patient was scheduled for Tuesday by the front.

## 2018-12-02 NOTE — Telephone Encounter (Signed)
Coronavirus (COVID-19) Are you at risk?  Are you at risk for the Coronavirus (COVID-19)?  To be considered HIGH RISK for Coronavirus (COVID-19), you have to meet the following criteria:  . Traveled to [place], [place], [place], [place] or [place]; or in the [place] to [place], [place], [place], or [place]; and have fever, cough, and shortness of breath within the last 2 weeks of travel OR . Been in close contact with a person diagnosed with COVID-19 within the last 2 weeks and have fever, cough, and shortness of breath . IF YOU DO NOT MEET THESE CRITERIA, YOU ARE CONSIDERED LOW RISK FOR COVID-19.  What to do if you are HIGH RISK for COVID-19?  . If you are having a medical emergency, call 911. . Seek medical care right away. Before you go to a doctor's office, urgent care or emergency department, call ahead and tell them about your recent travel, contact with someone diagnosed with COVID-19, and your symptoms. You should receive instructions from your physician's office regarding next steps of care.  . When you arrive at healthcare provider, tell the healthcare staff immediately you have returned from visiting [place], [place], [place], [place] or [place]; or traveled in the [place] to [place], [place], [place], or [place]; in the last two weeks or you have been in close contact with a person diagnosed with COVID-19 in the last 2 weeks.   . Tell the health care staff about your symptoms: fever, cough and shortness of breath. . After you have been seen by a medical provider, you will be either: o Tested for (COVID-19) and discharged home on quarantine except to seek medical care if symptoms worsen, and asked to  - Stay home and avoid contact with others until you get your results (4-5 days)  - Avoid travel on public transportation if possible (such as bus, train, or airplane) or o Sent to the Emergency Department by EMS for evaluation, COVID-19 testing, and possible  admission depending on your condition and test results.  What to do if you are LOW RISK for COVID-19?  Reduce your risk of any infection by using the same precautions used for avoiding the common cold or flu:  . Wash your hands often with soap and warm water for at least 20 seconds.  If soap and water are not readily available, use an alcohol-based hand sanitizer with at least 60% alcohol.  . If coughing or sneezing, cover your mouth and nose by coughing or sneezing into the elbow areas of your shirt or coat, into a tissue or into your sleeve (not your hands). . Avoid shaking hands with others and consider head nods or verbal greetings only. . Avoid touching your eyes, nose, or mouth with unwashed hands.  . Avoid close contact with people who are sick. . Avoid places or events with large numbers of people in one location, like concerts or sporting events. . Carefully consider travel plans you have or are making. . If you are planning any travel outside or inside the [place], visit the CDC's Travelers' Health webpage for the latest health notices. . If you have some symptoms but not all symptoms, continue to monitor at home and seek medical attention if your symptoms worsen. . If you are having a medical emergency, call 911.   ADDITIONAL HEALTHCARE OPTIONS FOR PATIENTS  [facility] / e-Visit: [email or web address]         [facility]: [phone]  [facility]: [phone]                   [facility]: [phone]   Prescreened. Neg .cm 

## 2018-12-06 ENCOUNTER — Ambulatory Visit (INDEPENDENT_AMBULATORY_CARE_PROVIDER_SITE_OTHER): Payer: Self-pay

## 2018-12-06 ENCOUNTER — Other Ambulatory Visit: Payer: Self-pay

## 2018-12-06 ENCOUNTER — Other Ambulatory Visit: Payer: Self-pay | Admitting: Obstetrics and Gynecology

## 2018-12-06 DIAGNOSIS — Z3491 Encounter for supervision of normal pregnancy, unspecified, first trimester: Secondary | ICD-10-CM

## 2018-12-06 DIAGNOSIS — Z3687 Encounter for antenatal screening for uncertain dates: Secondary | ICD-10-CM

## 2018-12-21 ENCOUNTER — Telehealth: Payer: Self-pay

## 2018-12-21 NOTE — Telephone Encounter (Signed)
Coronavirus (COVID-19) Are you at risk?  Are you at risk for the Coronavirus (COVID-19)?  To be considered HIGH RISK for Coronavirus (COVID-19), you have to meet the following criteria:  . Traveled to [place], [place], [place], [place] or [place]; or in the [place] to [place], [place], [place], or [place]; and have fever, cough, and shortness of breath within the last 2 weeks of travel OR . Been in close contact with a person diagnosed with COVID-19 within the last 2 weeks and have fever, cough, and shortness of breath . IF YOU DO NOT MEET THESE CRITERIA, YOU ARE CONSIDERED LOW RISK FOR COVID-19.  What to do if you are HIGH RISK for COVID-19?  . If you are having a medical emergency, call 911. . Seek medical care right away. Before you go to a doctor's office, urgent care or emergency department, call ahead and tell them about your recent travel, contact with someone diagnosed with COVID-19, and your symptoms. You should receive instructions from your physician's office regarding next steps of care.  . When you arrive at healthcare provider, tell the healthcare staff immediately you have returned from visiting [place], [place], [place], [place] or [place]; or traveled in the [place] to [place], [place], [place], or [place]; in the last two weeks or you have been in close contact with a person diagnosed with COVID-19 in the last 2 weeks.   . Tell the health care staff about your symptoms: fever, cough and shortness of breath. . After you have been seen by a medical provider, you will be either: o Tested for (COVID-19) and discharged home on quarantine except to seek medical care if symptoms worsen, and asked to  - Stay home and avoid contact with others until you get your results (4-5 days)  - Avoid travel on public transportation if possible (such as bus, train, or airplane) or o Sent to the Emergency Department by EMS for evaluation, COVID-19 testing, and possible  admission depending on your condition and test results.  What to do if you are LOW RISK for COVID-19?  Reduce your risk of any infection by using the same precautions used for avoiding the common cold or flu:  . Wash your hands often with soap and warm water for at least 20 seconds.  If soap and water are not readily available, use an alcohol-based hand sanitizer with at least 60% alcohol.  . If coughing or sneezing, cover your mouth and nose by coughing or sneezing into the elbow areas of your shirt or coat, into a tissue or into your sleeve (not your hands). . Avoid shaking hands with others and consider head nods or verbal greetings only. . Avoid touching your eyes, nose, or mouth with unwashed hands.  . Avoid close contact with people who are sick. . Avoid places or events with large numbers of people in one location, like concerts or sporting events. . Carefully consider travel plans you have or are making. . If you are planning any travel outside or inside the [place], visit the CDC's Travelers' Health webpage for the latest health notices. . If you have some symptoms but not all symptoms, continue to monitor at home and seek medical attention if your symptoms worsen. . If you are having a medical emergency, call 911.   ADDITIONAL HEALTHCARE OPTIONS FOR PATIENTS  [facility] / e-Visit: [email or web address]         [facility]: [phone]  [facility]: [phone]                   [facility]: [phone]   Prescreened. Neg .cm 

## 2018-12-22 ENCOUNTER — Ambulatory Visit (INDEPENDENT_AMBULATORY_CARE_PROVIDER_SITE_OTHER): Payer: Self-pay | Admitting: Obstetrics and Gynecology

## 2018-12-22 ENCOUNTER — Other Ambulatory Visit: Payer: Self-pay

## 2018-12-22 VITALS — BP 136/81 | HR 97 | Wt 155.9 lb

## 2018-12-22 DIAGNOSIS — Z3491 Encounter for supervision of normal pregnancy, unspecified, first trimester: Secondary | ICD-10-CM

## 2018-12-22 NOTE — Progress Notes (Signed)
Ieesha V Degregory presents for NOB nurse interview visit. Pregnancy confirmation done __05/[id number]________. G2. P0010-. Pregnancy education material explained and given. ___0_ cats in home.  Sickle cell ordered due to patient's race. HIV labs and drug screen were explained and ordered. PNV encouraged. Genetic screening options discussed. Genetic testing:Unsure. Patient may discuss with the provider. Patient to follow up with provider in __3__ weeks for NOB physical. All questions answered.

## 2018-12-23 LAB — URINALYSIS, ROUTINE W REFLEX MICROSCOPIC
Bilirubin, UA: NEGATIVE
Glucose, UA: NEGATIVE
Ketones, UA: NEGATIVE
Nitrite, UA: NEGATIVE
RBC, UA: NEGATIVE
Specific Gravity, UA: 1.02 (ref 1.005–1.030)
Urobilinogen, Ur: 1 mg/dL (ref 0.2–1.0)
pH, UA: 6.5 (ref 5.0–7.5)

## 2018-12-23 LAB — RPR: RPR Ser Ql: NONREACTIVE

## 2018-12-23 LAB — MICROSCOPIC EXAMINATION: Casts: NONE SEEN /lpf

## 2018-12-23 LAB — HIV ANTIBODY (ROUTINE TESTING W REFLEX): HIV Screen 4th Generation wRfx: NONREACTIVE

## 2018-12-23 LAB — ABO AND RH: Rh Factor: POSITIVE

## 2018-12-23 LAB — RUBELLA SCREEN: Rubella Antibodies, IGG: 15.8 index (ref >0.99)

## 2018-12-23 LAB — ANTIBODY SCREEN: Antibody Screen: NEGATIVE

## 2018-12-23 LAB — HGB SOLU + RFLX FRAC: Sickle Solubility Test - HGBRFX: NEGATIVE

## 2018-12-23 LAB — VARICELLA ZOSTER ANTIBODY, IGG: Varicella zoster IgG: 419 index (ref >165)

## 2018-12-23 LAB — HEPATITIS B SURFACE ANTIGEN: Hepatitis B Surface Ag: NEGATIVE

## 2018-12-24 LAB — CULTURE, OB URINE

## 2018-12-24 LAB — URINE CULTURE, OB REFLEX

## 2018-12-26 LAB — MONITOR DRUG PROFILE 14(MW)
Amphetamine Scrn, Ur: NEGATIVE ng/mL
BARBITURATE SCREEN URINE: NEGATIVE ng/mL
BENZODIAZEPINE SCREEN, URINE: NEGATIVE ng/mL
Buprenorphine, Urine: NEGATIVE ng/mL
Cocaine (Metab) Scrn, Ur: NEGATIVE ng/mL
Creatinine(Crt), U: 194.3 mg/dL (ref 20.0–300.0)
Fentanyl, Urine: NEGATIVE pg/mL
Meperidine Screen, Urine: NEGATIVE ng/mL
Methadone Screen, Urine: NEGATIVE ng/mL
OXYCODONE+OXYMORPHONE UR QL SCN: NEGATIVE ng/mL
Opiate Scrn, Ur: NEGATIVE ng/mL
Ph of Urine: 6.5 (ref 4.5–8.9)
Phencyclidine Qn, Ur: NEGATIVE ng/mL
Propoxyphene Scrn, Ur: NEGATIVE ng/mL
SPECIFIC GRAVITY: 1.017
Tramadol Screen, Urine: NEGATIVE ng/mL

## 2018-12-26 LAB — CANNABINOID (GC/MS), URINE
Cannabinoid: POSITIVE — AB
Carboxy THC (GC/MS): 41 ng/mL

## 2018-12-29 LAB — GC/CHLAMYDIA PROBE AMP
Chlamydia trachomatis, NAA: NEGATIVE
Neisseria Gonorrhoeae by PCR: NEGATIVE

## 2019-01-11 ENCOUNTER — Telehealth: Payer: Self-pay

## 2019-01-11 NOTE — Telephone Encounter (Signed)
[facility] for prescreening.  

## 2019-01-12 ENCOUNTER — Ambulatory Visit (INDEPENDENT_AMBULATORY_CARE_PROVIDER_SITE_OTHER): Payer: Medicaid Other | Admitting: Obstetrics and Gynecology

## 2019-01-12 ENCOUNTER — Other Ambulatory Visit (HOSPITAL_COMMUNITY)
Admission: RE | Admit: 2019-01-12 | Discharge: 2019-01-12 | Disposition: A | Discharge status: Home / Self Care | Payer: Medicaid Other | Source: Ambulatory Visit | Attending: Obstetrics and Gynecology | Admitting: Obstetrics and Gynecology

## 2019-01-12 ENCOUNTER — Encounter: Payer: Self-pay | Admitting: Obstetrics and Gynecology

## 2019-01-12 ENCOUNTER — Other Ambulatory Visit: Payer: Self-pay

## 2019-01-12 VITALS — BP 126/79 | HR 96 | Ht 62.0 in | Wt 158.6 lb

## 2019-01-12 DIAGNOSIS — Z124 Encounter for screening for malignant neoplasm of cervix: Secondary | ICD-10-CM | POA: Insufficient documentation

## 2019-01-12 DIAGNOSIS — Z3402 Encounter for supervision of normal first pregnancy, second trimester: Secondary | ICD-10-CM | POA: Diagnosis present

## 2019-01-12 DIAGNOSIS — Z3481 Encounter for supervision of other normal pregnancy, first trimester: Secondary | ICD-10-CM

## 2019-01-12 DIAGNOSIS — D242 Benign neoplasm of left breast: Secondary | ICD-10-CM

## 2019-01-12 DIAGNOSIS — E663 Overweight: Secondary | ICD-10-CM

## 2019-01-12 DIAGNOSIS — Z3A13 13 weeks gestation of pregnancy: Secondary | ICD-10-CM

## 2019-01-12 DIAGNOSIS — Z1379 Encounter for other screening for genetic and chromosomal anomalies: Secondary | ICD-10-CM

## 2019-01-12 LAB — POCT URINALYSIS DIPSTICK OB
Bilirubin, UA: NEGATIVE
Blood, UA: NEGATIVE
Glucose, UA: NEGATIVE
Ketones, UA: NEGATIVE
Leukocytes, UA: NEGATIVE
Nitrite, UA: NEGATIVE
POC,PROTEIN,UA: NEGATIVE
Spec Grav, UA: 1.015 (ref 1.010–1.025)
Urobilinogen, UA: 0.2 E.U./dL
pH, UA: 7 (ref 5.0–8.0)

## 2019-01-12 NOTE — Progress Notes (Addendum)
OBSTETRIC INITIAL PRENATAL VISIT  Subjective:    Brandi Watkins is being seen today for her first obstetrical visit.  This is not a planned pregnancy. She is a 23 y.o. G2P0010 female at [redacted]w[redacted]d gestation, Estimated Date of Delivery: 07/19/19 with Patient's last menstrual period was 10/16/2018, consistent with 7 week sono. Her obstetrical history is significant for none. Relationship with FOB: ex-significant other (notes she just ended relationship with FOB). Patient does intend to breast feed. Pregnancy history fully reviewed.    OB History  Gravida Para Term Preterm AB Living  2 0 0 0 1 0  SAB TAB Ectopic Multiple Live Births  1 0 0 0 0    # Outcome Date GA Lbr Len/2nd Weight Sex Delivery Anes PTL Lv  2 Current           1 SAB 2020            Gynecologic History:  Last pap smear was: patient has never had a pap smear.  Reports history of STIs: Chlamydia 2-3 years ago, treated.  Contraception: None.  Was on Nuvaring for a while prior to pregnancy   Past Medical History:  Diagnosis Date  . ADHD (attention deficit hyperactivity disorder)   . Anemia   . Chlamydia contact, treated 11/2016    Family History  Problem Relation Age of Onset  . Diabetes Mother   . Hypertension Mother   . Asthma Maternal Aunt     Past Surgical History:  Procedure Laterality Date  . NONE      Social History   Socioeconomic History  . Marital status: Single    Spouse name: Not on file  . Number of children: Not on file  . Years of education: Not on file  . Highest education level: Not on file  Occupational History  . Not on file  Social Needs  . Financial resource strain: Not on file  . Food insecurity    Worry: Not on file    Inability: Not on file  . Transportation needs    Medical: Not on file    Non-medical: Not on file  Tobacco Use  . Smoking status: Never Smoker  . Smokeless tobacco: Never Used  Substance and Sexual Activity  . Alcohol use: Not Currently  . Drug use: Not  Currently    Types: Marijuana    Comment: Daily   . Sexual activity: Yes  Lifestyle  . Physical activity    Days per week: Not on file    Minutes per session: Not on file  . Stress: Not on file  Relationships  . Social Herbalist on phone: Not on file    Gets together: Not on file    Attends religious service: Not on file    Active member of club or organization: Not on file    Attends meetings of clubs or organizations: Not on file    Relationship status: Not on file  . Intimate partner violence    Fear of current or ex partner: Not on file    Emotionally abused: Not on file    Physically abused: Not on file    Forced sexual activity: Not on file  Other Topics Concern  . Not on file  Social History Narrative  . Not on file    Current Outpatient Medications on File Prior to Visit  Medication Sig Dispense Refill  . Prenatal Vit-Fe Fumarate-FA (MULTIVITAMIN-PRENATAL) 27-0.8 MG TABS tablet Take 1 tablet by mouth daily  at 12 noon.    . pyridOXINE (VITAMIN B-6) 50 MG tablet Take 1 tablet (50 mg total) by mouth 3 (three) times daily as needed. (Patient not taking: Reported on 01/12/2019) 60 tablet 0   No current facility-administered medications on file prior to visit.     No Known Allergies    Review of Systems General: Not Present- Fever, Weight Loss and Weight Gain. Skin: Not Present- Rash. HEENT: Not Present- Blurred Vision, Headache and Bleeding Gums. Respiratory: Not Present- Difficulty Breathing. Breast: Not Present- Breast Mass. Cardiovascular: Not Present- Chest Pain, Elevated Blood Pressure, Fainting / Blacking Out and Shortness of Breath. Gastrointestinal: Not Present- Abdominal Pain, Constipation, and Vomiting.  Present (nausea- mild) Female Genitourinary: Not Present- Frequency, Painful Urination, Pelvic Pain, Vaginal Bleeding, Vaginal Discharge, Contractions, regular, Fetal Movements Decreased, Urinary Complaints and Vaginal Fluid. Musculoskeletal: Not  Present- Back Pain and Leg Cramps. Neurological: Not Present- Dizziness. Psychiatric: Not Present- Depression.     Objective:   Blood pressure 126/79, pulse 96, weight 158 lb 9.6 oz (71.9 kg), last menstrual period 10/16/2018.  Body mass index is 28.55 kg/m.  General Appearance:    Alert, cooperative, no distress, appears stated age, overweight  Head:    Normocephalic, without obvious abnormality, atraumatic  Eyes:    PERRL, conjunctiva/corneas clear, EOM's intact, both eyes  Ears:    Normal external ear canals, both ears  Nose:   Nares normal, septum midline, mucosa normal, no drainage or sinus tenderness  Throat:   Lips, mucosa, and tongue normal; teeth and gums normal  Neck:   Supple, symmetrical, trachea midline, no adenopathy; thyroid: no enlargement/tenderness/nodules; no carotid bruit or JVD  Back:     Symmetric, no curvature, ROM normal, no CVA tenderness  Lungs:     Clear to auscultation bilaterally, respirations unlabored  Chest Wall:    No tenderness or deformity   Heart:    Regular rate and rhythm, S1 and S2 normal, no murmur, rub or gallop  Breast Exam:    No tenderness, or nipple abnormality. 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   Abdomen:     Soft, non-tender, bowel sounds active all four quadrants, no masses, no organomegaly.  FH 13.  FHT 163 bpm.  Genitalia:    Pelvic:external genitalia normal, vagina without lesions, discharge, or tenderness, rectovaginal septum  normal. Cervix normal in appearance, no cervical motion tenderness, no adnexal masses or tenderness.  Pregnancy positive findings: uterine enlargement: 13 wk size, nontender.   Rectal:    Normal external sphincter.  No hemorrhoids appreciated. Internal exam not done.   Extremities:   Extremities normal, atraumatic, no cyanosis or edema  Pulses:   2+ and symmetric all extremities  Skin:   Skin color, texture, turgor normal, no rashes or lesions  Lymph nodes:    Cervical, supraclavicular, and axillary nodes normal  Neurologic:   CNII-XII intact, normal strength, sensation and reflexes throughout      Assessment:    Pregnancy at 13 and 1/7 weeks   Overweight  Plan:    Initial labs reviewed.  CBC ordered.  Prenatal vitamins encouraged. Problem list reviewed and updated. New OB counseling:  The patient has been given an overview regarding routine prenatal care.  Recommendations regarding diet, weight gain, and exercise in pregnancy were given. Prenatal testing, optional genetic testing, and ultrasound use in pregnancy were reviewed.  Patient desires cell-free DNA testing with [employer] and [employer].  Benefits of Breast Feeding were discussed. The  patient is encouraged to consider nursing her baby post partum. Patient notes that she desires midwifery care. Inquires regarding waterbirth and home birth (notes concerns regarding being in the hospital during COVID-19). Discussed that at present time water births are on hold at [facility] and some surrounding areas due to COVID-19, however things could change during the course of her pregnancy. Advised that we are taking measures in the hospital to keep all women safe during their admissions and births. Will refer to midwifery care.  Follow up in 4 weeks.   The patient has Medicaid.  [facility] Medicaid Risk Screening Form completed today   50% of 30 min visit spent on counseling and coordination of care.       Rubie Maid, MD [facility]

## 2019-01-12 NOTE — Patient Instructions (Signed)
Second Trimester of Pregnancy  The second trimester is from week 14 through week 27 (month 4 through 6). This is often the time in pregnancy that you feel your best. Often times, morning sickness has lessened or quit. You may have more energy, and you may get hungry more often. Your unborn baby is growing rapidly. At the end of the sixth month, he or she is about 9 inches long and weighs about 1 pounds. You will likely feel the baby move between 18 and 20 weeks of pregnancy. Follow these instructions at home: Medicines  Take over-the-counter and prescription medicines only as told by your doctor. Some medicines are safe and some medicines are not safe during pregnancy.  Take a prenatal vitamin that contains at least 600 micrograms (mcg) of folic acid.  If you have trouble pooping (constipation), take medicine that will make your stool soft (stool softener) if your doctor approves. Eating and drinking   Eat regular, healthy meals.  Avoid raw meat and uncooked cheese.  If you get low calcium from the food you eat, talk to your doctor about taking a daily calcium supplement.  Avoid foods that are high in fat and sugars, such as fried and sweet foods.  If you feel sick to your stomach (nauseous) or throw up (vomit): ? Eat 4 or 5 small meals a day instead of 3 large meals. ? Try eating a few soda crackers. ? Drink liquids between meals instead of during meals.  To prevent constipation: ? Eat foods that are high in fiber, like fresh fruits and vegetables, whole grains, and beans. ? Drink enough fluids to keep your pee (urine) clear or pale yellow. Activity  Exercise only as told by your doctor. Stop exercising if you start to have cramps.  Do not exercise if it is too hot, too humid, or if you are in a place of great height (high altitude).  Avoid heavy lifting.  Wear low-heeled shoes. Sit and stand up straight.  You can continue to have sex unless your doctor tells you not to.  Relieving pain and discomfort  Wear a good support bra if your breasts are tender.  Take warm water baths (sitz baths) to soothe pain or discomfort caused by hemorrhoids. Use hemorrhoid cream if your doctor approves.  Rest with your legs raised if you have leg cramps or low back pain.  If you develop puffy, bulging veins (varicose veins) in your legs: ? Wear support hose or compression stockings as told by your doctor. ? Raise (elevate) your feet for 15 minutes, 3-4 times a day. ? Limit salt in your food. Prenatal care  Write down your questions. Take them to your prenatal visits.  Keep all your prenatal visits as told by your doctor. This is important. Safety  Wear your seat belt when driving.  Make a list of emergency phone numbers, including numbers for family, friends, the hospital, and police and fire departments. General instructions  Ask your doctor about the right foods to eat or for help finding a counselor, if you need these services.  Ask your doctor about local prenatal classes. Begin classes before month 6 of your pregnancy.  Do not use hot tubs, steam rooms, or saunas.  Do not douche or use tampons or scented sanitary pads.  Do not cross your legs for long periods of time.  Visit your dentist if you have not done so. Use a soft toothbrush to brush your teeth. Floss gently.  Avoid all smoking, herbs,   and alcohol. Avoid drugs that are not approved by your doctor.  Do not use any products that contain nicotine or tobacco, such as cigarettes and e-cigarettes. If you need help quitting, ask your doctor.  Avoid cat litter boxes and soil used by cats. These carry germs that can cause birth defects in the baby and can cause a loss of your baby (miscarriage) or stillbirth. Contact a doctor if:  You have mild cramps or pressure in your lower belly.  You have pain when you pee (urinate).  You have bad smelling fluid coming from your vagina.  You continue to feel  sick to your stomach (nauseous), throw up (vomit), or have watery poop (diarrhea).  You have a nagging pain in your belly area.  You feel dizzy. Get help right away if:  You have a fever.  You are leaking fluid from your vagina.  You have spotting or bleeding from your vagina.  You have severe belly cramping or pain.  You lose or gain weight rapidly.  You have trouble catching your breath and have chest pain.  You notice sudden or extreme puffiness (swelling) of your face, hands, ankles, feet, or legs.  You have not felt the baby move in over an hour.  You have severe headaches that do not go away when you take medicine.  You have trouble seeing. Summary  The second trimester is from week 14 through week 27 (months 4 through 6). This is often the time in pregnancy that you feel your best.  To take care of yourself and your unborn baby, you will need to eat healthy meals, take medicines only if your doctor tells you to do so, and do activities that are safe for you and your baby.  Call your doctor if you get sick or if you notice anything unusual about your pregnancy. Also, call your doctor if you need help with the right food to eat, or if you want to know what activities are safe for you. This information is not intended to replace advice given to you by your health care provider. Make sure you discuss any questions you have with your health care provider. Document Released: 09/23/2009 Document Revised: 10/21/2018 Document Reviewed: 08/04/2016 Elsevier Patient Education  2020 [employer]. How a Baby Grows During Pregnancy  Pregnancy begins when a female's sperm enters a female's egg (fertilization). Fertilization usually happens in one of the tubes (fallopian tubes) that connect the ovaries to the womb (uterus). The fertilized egg moves down the fallopian tube to the uterus. Once it reaches the uterus, it implants into the lining of the uterus and begins to grow. For the first  10 weeks, the fertilized egg is called an embryo. After 10 weeks, it is called a fetus. As the fetus continues to grow, it receives oxygen and nutrients through tissue (placenta) that grows to support the developing baby. The placenta is the life support system for the baby. It provides oxygen and nutrition and removes waste. Learning as much as you can about your pregnancy and how your baby is developing can help you enjoy the experience. It can also make you aware of when there might be a problem and when to ask questions. How long does a typical pregnancy last? A pregnancy usually lasts 280 days, or about 40 weeks. Pregnancy is divided into three periods of growth, also called trimesters:  First trimester: 0-12 weeks.  Second trimester: 13-27 weeks.  Third trimester: 28-40 weeks. The day when your baby is  ready to be born (full term) is your estimated date of delivery. How does my baby develop month by month? First month  The fertilized egg attaches to the inside of the uterus.  Some cells will form the placenta. Others will form the fetus.  The arms, legs, brain, spinal cord, lungs, and heart begin to develop.  At the end of the first month, the heart begins to beat. Second month  The bones, inner ear, eyelids, hands, and feet form.  The genitals develop.  By the end of 8 weeks, all major organs are developing. Third month  All of the internal organs are forming.  Teeth develop below the gums.  Bones and muscles begin to grow. The spine can flex.  The skin is transparent.  Fingernails and toenails begin to form.  Arms and legs continue to grow longer, and hands and feet develop.  The fetus is about 3 inches (7.6 cm) long. Fourth month  The placenta is completely formed.  The external sex organs, neck, outer ear, eyebrows, eyelids, and fingernails are formed.  The fetus can hear, swallow, and move its arms and legs.  The kidneys begin to produce urine.  The  skin is covered with a white, waxy coating (vernix) and very fine hair (lanugo). Fifth month  The fetus moves around more and can be felt for the first time (quickening).  The fetus starts to sleep and wake up and may begin to suck its finger.  The nails grow to the end of the fingers.  The organ in the digestive system that makes bile (gallbladder) functions and helps to digest nutrients.  If your baby is a girl, eggs are present in her ovaries. If your baby is a boy, testicles start to move down into his scrotum. Sixth month  The lungs are formed.  The eyes open. The brain continues to develop.  Your baby has fingerprints and toe prints. Your baby's hair grows thicker.  At the end of the second trimester, the fetus is about 9 inches (22.9 cm) long. Seventh month  The fetus kicks and stretches.  The eyes are developed enough to sense changes in light.  The hands can make a grasping motion.  The fetus responds to sound. Eighth month  All organs and body systems are fully developed and functioning.  Bones harden, and taste buds develop. The fetus may hiccup.  Certain areas of the brain are still developing. The skull remains soft. Ninth month  The fetus gains about  lb (0.23 kg) each week.  The lungs are fully developed.  Patterns of sleep develop.  The fetus's head typically moves into a head-down position (vertex) in the uterus to prepare for birth.  The fetus weighs 6-9 lb (2.72-4.08 kg) and is 19-20 inches (48.26-50.8 cm) long. What can I do to have a healthy pregnancy and help my baby develop? General instructions  Take prenatal vitamins as directed by your health care provider. These include vitamins such as folic acid, iron, calcium, and vitamin D. They are important for healthy development.  Take medicines only as directed by your health care provider. Read labels and ask a pharmacist or your health care provider whether over-the-counter medicines,  supplements, and prescription drugs are safe to take during pregnancy.  Keep all follow-up visits as directed by your health care provider. This is important. Follow-up visits include prenatal care and screening tests. How do I know if my baby is developing well? At each prenatal visit, your health care  provider will do several different tests to check on your health and keep track of your baby's development. These include:  Fundal height and position. ? Your health care provider will measure your growing belly from your pubic bone to the top of the uterus using a tape measure. ? Your health care provider will also feel your belly to determine your baby's position.  Heartbeat. ? An ultrasound in the first trimester can confirm pregnancy and show a heartbeat, depending on how far along you are. ? Your health care provider will check your baby's heart rate at every prenatal visit.  Second trimester ultrasound. ? This ultrasound checks your baby's development. It also may show your baby's gender. What should I do if I have concerns about my baby's development? Always talk with your health care provider about any concerns that you may have about your pregnancy and your baby. Summary  A pregnancy usually lasts 280 days, or about 40 weeks. Pregnancy is divided into three periods of growth, also called trimesters.  Your health care provider will monitor your baby's growth and development throughout your pregnancy.  Follow your health care provider's recommendations about taking prenatal vitamins and medicines during your pregnancy.  Talk with your health care provider if you have any concerns about your pregnancy or your developing baby. This information is not intended to replace advice given to you by your health care provider. Make sure you discuss any questions you have with your health care provider. Document Released: 12/16/2007 Document Revised: 10/20/2018 Document Reviewed: 05/12/2017  Elsevier Patient Education  2020 [employer].

## 2019-01-12 NOTE — Progress Notes (Signed)
NOB-Pt present today for nob pe.  Pt stated that she is doing well.

## 2019-01-13 LAB — CBC
Hematocrit: 36.9 % (ref 34.0–46.6)
Hemoglobin: 12.6 g/dL (ref 11.1–15.9)
MCH: 31.2 pg (ref 26.6–33.0)
MCHC: 34.1 g/dL (ref 31.5–35.7)
MCV: 91 fL (ref 79–97)
Platelets: 244 10*3/uL (ref 150–450)
RBC: 4.04 x10E6/uL (ref 3.77–5.28)
RDW: 12.7 % (ref 11.7–15.4)
WBC: 13.1 10*3/uL — ABNORMAL HIGH (ref 3.4–10.8)

## 2019-01-17 LAB — CYTOLOGY - PAP

## 2019-01-20 ENCOUNTER — Telehealth: Payer: Self-pay | Admitting: Family Medicine

## 2019-01-20 NOTE — Telephone Encounter (Signed)
TC with patient.  Patient wanted to know if [facility] performed pap at her visit in April 2020. Explained other testing was done but no pap.  Patient concerned about her recent pap at [facility].  Discussed pap f/u with patient; verbalized understanding. Aileen Fass, RN

## 2019-01-20 NOTE — Telephone Encounter (Signed)
Attempted TC x 2. It sounds like someone picks up phone but doesn't say anything. Will attempt later this am to reach patient. Aileen Fass, RN

## 2019-01-20 NOTE — Telephone Encounter (Signed)
Has questions for nurse regarding stds and wants her results as well.

## 2019-01-23 ENCOUNTER — Telehealth: Payer: Self-pay | Admitting: Certified Nurse Midwife

## 2019-01-23 NOTE — Telephone Encounter (Signed)
The patient called and stated that she has a mass/lump on her left breast and it is painful to the touch. The patient is requesting a call back if possible as soon as possible. Please advise.

## 2019-01-30 DIAGNOSIS — D242 Benign neoplasm of left breast: Secondary | ICD-10-CM | POA: Insufficient documentation

## 2019-02-07 ENCOUNTER — Encounter: Payer: Medicaid Other | Admitting: Certified Nurse Midwife

## 2019-02-07 ENCOUNTER — Telehealth: Payer: Self-pay | Admitting: Maternal Newborn

## 2019-02-07 ENCOUNTER — Other Ambulatory Visit: Payer: Self-pay

## 2019-02-07 ENCOUNTER — Ambulatory Visit (INDEPENDENT_AMBULATORY_CARE_PROVIDER_SITE_OTHER): Payer: Self-pay | Admitting: Maternal Newborn

## 2019-02-07 ENCOUNTER — Encounter: Payer: Self-pay | Admitting: Maternal Newborn

## 2019-02-07 VITALS — BP 110/70 | Wt 193.0 lb

## 2019-02-07 DIAGNOSIS — R21 Rash and other nonspecific skin eruption: Secondary | ICD-10-CM

## 2019-02-07 DIAGNOSIS — Z3689 Encounter for other specified antenatal screening: Secondary | ICD-10-CM

## 2019-02-07 DIAGNOSIS — O9989 Other specified diseases and conditions complicating pregnancy, childbirth and the puerperium: Secondary | ICD-10-CM

## 2019-02-07 DIAGNOSIS — Z3A16 16 weeks gestation of pregnancy: Secondary | ICD-10-CM

## 2019-02-07 DIAGNOSIS — N6320 Unspecified lump in the left breast, unspecified quadrant: Secondary | ICD-10-CM

## 2019-02-07 DIAGNOSIS — Z3480 Encounter for supervision of other normal pregnancy, unspecified trimester: Secondary | ICD-10-CM

## 2019-02-07 NOTE — Telephone Encounter (Signed)
Called and left voicemail for patient to call back to confirm schedule appointment at [facility] on [place], 02/09/19 at 10 am.

## 2019-02-07 NOTE — Patient Instructions (Signed)

## 2019-02-07 NOTE — Progress Notes (Signed)
02/07/2019   Chief Complaint: Desires prenatal care.  Transfer of Care Patient: Yes, seen at [facility] for two prenatal visits with ultrasound, labs and genetic screening done  History of Present Illness: Ms. Clos is a 23 y.o. G2P0010 at [redacted]w[redacted]d based on Ultrasound, with an Estimated Date of Delivery: 07/19/2019, with the above CC.   Her periods were: regular periods every month lasting 7 days  She has Positive signs or symptoms of nausea/vomiting of pregnancy. She has Negative signs or symptoms of miscarriage or preterm labor She identifies Negative Zika risk factors for her and her partner On any different medications around the time she conceived/early pregnancy: No  History of varicella: No   Review of Systems  Constitutional: Negative.   HENT: Negative.   Eyes: Negative.   Respiratory: Negative for shortness of breath and wheezing.   Cardiovascular: Negative for chest pain and palpitations.  Gastrointestinal: Positive for nausea. Negative for vomiting.  Genitourinary: Negative for dysuria.       Increase in normal discharge, clear  Musculoskeletal: Negative.   Skin: Positive for rash.       Recurring under nose, seasonal, currently under right nostril  Neurological: Negative.   Endo/Heme/Allergies: Negative.   Psychiatric/Behavioral: Negative.   Breasts: Positive for new or changing breast lumps, tenderness Negative for nipple changes or nipple discharge  Review of systems was otherwise negative, except as stated in the above HPI.  OBGYN History: As per HPI. OB History  Gravida Para Term Preterm AB Living  2 0 0 0 1 0  SAB TAB Ectopic Multiple Live Births  1 0 0 0      # Outcome Date GA Lbr Len/2nd Weight Sex Delivery Anes PTL Lv  2 Current           1 SAB 2020            Any issues with any prior pregnancies: not applicable Any prior children are healthy, doing well, without any problems or issues: not applicable History of pap smears: Yes. Last pap smear  01/12/2019. Abnormal: Yes, LSIL History of STIs: Yes, HPV   Past Medical History: Past Medical History:  Diagnosis Date  . ADHD (attention deficit hyperactivity disorder)   . Anemia   . Chlamydia contact, treated 11/2016    Past Surgical History: Past Surgical History:  Procedure Laterality Date  . NONE      Family History:  Family History  Problem Relation Age of Onset  . Diabetes Mother   . Hypertension Mother   . Asthma Maternal Aunt    She denies any female cancers, bleeding or blood clotting disorders.  She reports that FOB has a cousin with autism. No other history of intellectual disability, birth defects or genetic disorders in her or the FOB's history.  Social History:  Social History   Socioeconomic History  . Marital status: Single    Spouse name: Not on file  . Number of children: Not on file  . Years of education: Not on file  . Highest education level: Not on file  Occupational History  . Not on file  Social Needs  . Financial resource strain: Not on file  . Food insecurity    Worry: Not on file    Inability: Not on file  . Transportation needs    Medical: Not on file    Non-medical: Not on file  Tobacco Use  . Smoking status: Never Smoker  . Smokeless tobacco: Never Used  Substance and Sexual Activity  .  Alcohol use: Not Currently  . 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 Sexual activity: Yes  Lifestyle  . Physical activity    Days per week: Not on file    Minutes per session: Not on file  . Stress: Not on file  Relationships  . Social Herbalist on phone: Not on file    Gets together: Not on file    Attends religious service: Not on file    Active member of club or organization: Not on file    Attends meetings of clubs or organizations: Not on file    Relationship status: Not on file  . Intimate partner violence    Fear of current or ex partner: Not on file    Emotionally abused: Not on file     Physically abused: Not on file    Forced sexual activity: Not on file  Other Topics Concern  . Not on file  Social History Narrative  . Not on file   Any cats in the household: no Domestic violence screening is negative.  Allergy: No Known Allergies  Current Outpatient Medications:  Current Outpatient Medications:  .  Prenatal Vit-Fe Fumarate-FA (MULTIVITAMIN-PRENATAL) 27-0.8 MG TABS tablet, Take 1 tablet by mouth daily at 12 noon., Disp: , Rfl:  .  pyridOXINE (VITAMIN B-6) 50 MG tablet, Take 1 tablet (50 mg total) by mouth 3 (three) times daily as needed., Disp: 60 tablet, Rfl: 0   Physical Exam:   BP 110/70   Wt 193 lb (87.5 kg)   LMP 11/23/2018   Breastfeeding Unknown   BMI 35.30 kg/m  Body mass index is 35.3 kg/m. Constitutional: Well nourished, well developed female in no acute distress.  Neck:  Supple, normal appearance, and no thyromegaly  Cardiovascular: S1, S2 normal, no murmur, rub or gallop, regular rate and rhythm Respiratory:  Clear to auscultation bilaterally. Normal respiratory effort Abdomen: No masses, hernias; diffusely non tender to palpation, non distended Breasts: right breast normal without mass, skin or nipple changes or axillary nodes, mass palpable on left breast, approximately 4.5 cm diameter, upper inner quadrant around 11 o'clock, tender to palpation Neuro/Psych:  Normal mood and affect.  Skin:  Warm and dry. Eruption under right nostril, skin color with small papules Lymphatic:  No inguinal lymphadenopathy.  Pelvic exam: Deferred, recently completed at previous visit during this pregnancy and no bleeding/symptoms today  Assessment: Ms. Joy is a 23 y.o. G2P0010 at [redacted]w[redacted]d based on Ultrasound with an Estimated Date of Delivery: 07/19/2019, presenting for prenatal care.  Plan:  1) Hospital and practice style delivering at [facility] discussed  2) NOB labs completed and available to review in Epic. 3) Genetic Screening completed previously, cannot see  results in Epic, advised patient to call for results. 4) Referral for ultrasound for tender breast mass on the left side. 5) Referral to dermatology for rash/eruption under right nostril that recurs.  Problem list reviewed and updated.  Avel Sensor, CNM [facility], [facility] 02/07/2019  9:41 AM

## 2019-02-07 NOTE — Telephone Encounter (Signed)
Patient aware of date location and time. Psychologist, forensic ID

## 2019-02-08 ENCOUNTER — Encounter: Payer: Self-pay | Admitting: Maternal Newborn

## 2019-02-08 DIAGNOSIS — Z3480 Encounter for supervision of other normal pregnancy, unspecified trimester: Secondary | ICD-10-CM | POA: Insufficient documentation

## 2019-02-09 ENCOUNTER — Ambulatory Visit
Admission: RE | Admit: 2019-02-09 | Discharge: 2019-02-09 | Disposition: A | Discharge status: Home / Self Care | Payer: Medicaid Other | Source: Ambulatory Visit | Attending: Maternal Newborn | Admitting: Maternal Newborn

## 2019-02-09 DIAGNOSIS — N6320 Unspecified lump in the left breast, unspecified quadrant: Secondary | ICD-10-CM | POA: Insufficient documentation

## 2019-02-14 ENCOUNTER — Telehealth: Payer: Self-pay

## 2019-02-14 NOTE — Telephone Encounter (Signed)
Spoke to pt to talk about her natera results. Pt stated that she would just wait until her 20 week ultrasound and that she has transferred to [place].

## 2019-03-07 ENCOUNTER — Other Ambulatory Visit: Payer: Self-pay

## 2019-03-07 ENCOUNTER — Ambulatory Visit (INDEPENDENT_AMBULATORY_CARE_PROVIDER_SITE_OTHER): Payer: Medicaid Other | Admitting: Maternal Newborn

## 2019-03-07 ENCOUNTER — Encounter: Payer: Self-pay | Admitting: Maternal Newborn

## 2019-03-07 ENCOUNTER — Ambulatory Visit (INDEPENDENT_AMBULATORY_CARE_PROVIDER_SITE_OTHER): Payer: Medicaid Other

## 2019-03-07 VITALS — BP 120/80 | Wt 165.8 lb

## 2019-03-07 DIAGNOSIS — Z3482 Encounter for supervision of other normal pregnancy, second trimester: Secondary | ICD-10-CM

## 2019-03-07 DIAGNOSIS — Z3689 Encounter for other specified antenatal screening: Secondary | ICD-10-CM

## 2019-03-07 DIAGNOSIS — Z3480 Encounter for supervision of other normal pregnancy, unspecified trimester: Secondary | ICD-10-CM

## 2019-03-07 DIAGNOSIS — Z363 Encounter for antenatal screening for malformations: Secondary | ICD-10-CM | POA: Diagnosis not present

## 2019-03-07 DIAGNOSIS — Z3A20 20 weeks gestation of pregnancy: Secondary | ICD-10-CM

## 2019-03-07 LAB — POCT URINALYSIS DIPSTICK OB
Glucose, UA: NEGATIVE
POC,PROTEIN,UA: NEGATIVE

## 2019-03-07 NOTE — Progress Notes (Signed)
    Routine Prenatal Care Visit  Subjective  Brandi Watkins is a 23 y.o. G2P0010 at [redacted]w[redacted]d being seen today for ongoing prenatal care.  She is currently monitored for the following issues for this low-risk pregnancy and has Breast fibroadenoma in female, left and Supervision of other normal pregnancy, antepartum on their problem list.  ----------------------------------------------------------------------------------- Patient reports hip pain after sleeping on her side. Vag. Bleeding: None.  Movement: Present. No leaking of fluid.  ----------------------------------------------------------------------------------- The following portions of the patient's history were reviewed and updated as appropriate: allergies, current medications, past family history, past medical history, past social history, past surgical history and problem list. Problem list updated.   Objective  Blood pressure 120/80, weight 165 lb 12.8 oz (75.2 kg), last menstrual period 11/23/2018, unknown if currently breastfeeding. Pregravid weight 155 lb (70.3 kg) Total Weight Gain 10 lb 12.8 oz (4.899 kg)  Urinalysis: Urine dipstick shows negative for glucose, protein.  Fetal Status: Fetal Heart Rate (bpm): 155 ([place])   Movement: Present     General:  Alert, oriented and cooperative. Patient is in no acute distress.  Skin: Skin is warm and dry. No rash noted.   Cardiovascular: Normal heart rate noted  Respiratory: Normal respiratory effort, no problems with respiration noted  Abdomen: Soft, gravid, appropriate for gestational age. Pain/Pressure: Absent     Pelvic:  Cervical exam deferred        Extremities: Normal range of motion.     Mental Status: Normal mood and affect. Normal behavior. Normal judgment and thought content.     Assessment   23 y.o. G2P0010 at [redacted]w[redacted]d, EDD 07/19/2019 by Ultrasound presenting for a routine prenatal visit.  Plan   pregnancy2 Problems (from 11/23/18 to present)    Problem Noted Resolved   Supervision of other normal pregnancy, antepartum 02/08/2019 by Rexene Agent, CNM No   Overview Addendum 02/08/2019 12:41 PM by Rexene Agent, [facility] Prenatal Labs  Dating Ultrasound 7w 6d Blood type: O/Positive/-- (06/11 1407)   Genetic Screen 1 Screen:    AFP:     Quad:     NIPS: Antibody:Negative (06/11 1407)  Anatomic [place]  Rubella: 15.80 (06/11 1407) Varicella: Immune  GTT Early:               Third trimester:  RPR: Non Reactive (06/11 1407)   Rhogam  HBsAg: Negative (06/11 1407)   TDaP vaccine                       Flu Shot: HIV: Non Reactive (06/11 1407)   Baby Food                                GBS:   Contraception  Pap: LSIL 01/12/2019  CBB     CS/VBAC    Support Person                 Anatomy scan today is complete and shows normal anatomy, gender surprise. Marginal cord insertion. Baby measuring small for dates. Reviewed results with patient.  Growth scan in 4 weeks.  Discussed comfort measures for hip pain.  Please refer to After Visit Summary for other counseling recommendations.   Return in about 4 weeks (around 04/04/2019) for ROB and growth scan.  Avel Sensor, CNM 03/07/2019  11:39 AM

## 2019-03-07 NOTE — Progress Notes (Signed)
ROB- has some dating questions

## 2019-03-07 NOTE — Patient Instructions (Signed)

## 2019-03-29 ENCOUNTER — Other Ambulatory Visit: Payer: Self-pay | Admitting: Maternal Newborn

## 2019-03-29 DIAGNOSIS — Z3689 Encounter for other specified antenatal screening: Secondary | ICD-10-CM

## 2019-03-29 DIAGNOSIS — O43199 Other malformation of placenta, unspecified trimester: Secondary | ICD-10-CM

## 2019-03-29 NOTE — Progress Notes (Signed)
MFM referral for marginal cord insertion and growth less than 5th percentile on anatomy scan.

## 2019-03-30 ENCOUNTER — Other Ambulatory Visit: Payer: Self-pay

## 2019-03-30 ENCOUNTER — Other Ambulatory Visit: Payer: Self-pay | Admitting: Maternal Newborn

## 2019-03-30 DIAGNOSIS — Z3689 Encounter for other specified antenatal screening: Secondary | ICD-10-CM

## 2019-03-30 DIAGNOSIS — O43199 Other malformation of placenta, unspecified trimester: Secondary | ICD-10-CM

## 2019-04-04 ENCOUNTER — Other Ambulatory Visit: Payer: Self-pay

## 2019-04-04 ENCOUNTER — Ambulatory Visit: Payer: Medicaid Other

## 2019-04-04 ENCOUNTER — Encounter: Payer: Self-pay | Admitting: Obstetrics and Gynecology

## 2019-04-04 ENCOUNTER — Ambulatory Visit (INDEPENDENT_AMBULATORY_CARE_PROVIDER_SITE_OTHER): Payer: Medicaid Other | Admitting: Obstetrics and Gynecology

## 2019-04-04 VITALS — BP 122/62 | Wt 174.0 lb

## 2019-04-04 DIAGNOSIS — Z3A24 24 weeks gestation of pregnancy: Secondary | ICD-10-CM

## 2019-04-04 DIAGNOSIS — R300 Dysuria: Secondary | ICD-10-CM

## 2019-04-04 DIAGNOSIS — N760 Acute vaginitis: Secondary | ICD-10-CM

## 2019-04-04 DIAGNOSIS — O26892 Other specified pregnancy related conditions, second trimester: Secondary | ICD-10-CM

## 2019-04-04 DIAGNOSIS — Z3480 Encounter for supervision of other normal pregnancy, unspecified trimester: Secondary | ICD-10-CM

## 2019-04-04 NOTE — Progress Notes (Signed)
ROB Wants to talk about changing job C/o vaginal itching, soreness, and odor  Denies lof, no vb, Good FM Declines flu shot for entire pregnancy

## 2019-04-04 NOTE — Progress Notes (Signed)
Routine Prenatal Care Visit  Subjective  Brandi Watkins is a 23 y.o. G2P0010 at [redacted]w[redacted]d being seen today for ongoing prenatal care.  She is currently monitored for the following issues for this high-risk pregnancy and has Breast fibroadenoma in female, left; Supervision of other normal pregnancy, antepartum; and Marginal insertion of umbilical cord affecting management of mother on their problem list.  ----------------------------------------------------------------------------------- Patient reports increase in vaginal discharge and irritation.   Contractions: Not present. Vag. Bleeding: None.  Movement: Present. Denies leaking of fluid.  ----------------------------------------------------------------------------------- The following portions of the patient's history were reviewed and updated as appropriate: allergies, current medications, past family history, past medical history, past social history, past surgical history and problem list. Problem list updated.   Objective  Blood pressure 122/62, weight 174 lb (78.9 kg), last menstrual period 11/23/2018, unknown if currently breastfeeding. Pregravid weight 155 lb (70.3 kg) Total Weight Gain 19 lb (8.618 kg) Urinalysis:      Fetal Status: Fetal Heart Rate (bpm): 160 Fundal Height: 24 cm Movement: Present     General:  Alert, oriented and cooperative. Patient is in no acute distress.  Skin: Skin is warm and dry. No rash noted.   Cardiovascular: Normal heart rate noted  Respiratory: Normal respiratory effort, no problems with respiration noted  Abdomen: Soft, gravid, appropriate for gestational age. Pain/Pressure: Absent     Pelvic:  Cervical exam deferred        Extremities: Normal range of motion.  Edema: Trace  Mental Status: Normal mood and affect. Normal behavior. Normal judgment and thought content.     Assessment   23 y.o. G2P0010 at [redacted]w[redacted]d by  07/19/2019, by Ultrasound presenting for routine prenatal visit  Plan    pregnancy2 Problems (from 11/23/18 to present)    Problem Noted Resolved   Supervision of other normal pregnancy, antepartum 02/08/2019 by Rexene Agent, CNM No   Overview Addendum 04/04/2019  9:59 AM by Homero Fellers, MD    Clinic [facility] Prenatal Labs  Dating Ultrasound 7w 6d Blood type: O/Positive/-- (06/11 1407)   Genetic Screen 1 Screen:    AFP:     Quad:     NIPS: Antibody:Negative (06/11 1407)  Anatomic [place] Complete, growth less than 5% Rubella: 15.80 (06/11 1407) Varicella: Immune  GTT Third trimester:  RPR: Non Reactive (06/11 1407)   Rhogam  not needed HBsAg: Negative (06/11 1407)   TDaP vaccine                        Flu Shot:Declines HIV: Non Reactive (06/11 1407)   Baby Food                                GBS:   Contraception  Pap: LSIL 01/12/2019  CBB     CS/VBAC    Support Person                  Gestational age appropriate obstetric precautions including but not limited to vaginal bleeding, contractions, leaking of fluid and fetal movement were reviewed in detail with the patient.    Return in about 2 weeks (around 04/18/2019) for ROB with MD.  Lenard Forth Prep: Clue Cells: Negative Fungal elements: Negative Trichomonas: Negative  Given information on healthy eating Says that genetic screening was not performed at [facility], "I had my blood drawn but there were no results. "  Patient has follow up planned  for growth [place] with MFM.   Homero Fellers MD [facility], [facility] 04/04/2019, 10:08 AM

## 2019-04-04 NOTE — Patient Instructions (Signed)
Budget-Friendly Healthy Eating There are many ways to save money at the grocery store and continue to eat healthy. You can be successful if you:  Plan meals according to your budget.  Make a grocery list and only purchase food according to your grocery list.  Prepare food yourself. What are tips for following this plan?  Reading food labels  Compare food labels between brand name foods and the store brand. Often the nutritional value is the same, but the store brand is lower cost.  Look for products that do not have added sugar, fat, or salt (sodium). These often cost the same but are healthier for you. Products may be labeled as: ? Sugar-free. ? Nonfat. ? Low-fat. ? Sodium-free. ? Low-sodium.  Look for lean ground beef labeled as at least 92% lean and 8% fat. Shopping  Buy only the items on your grocery list and go only to the areas of the store that have the items on your list.  Use coupons only for foods and brands you normally buy. Avoid buying items you wouldn't normally buy simply because they are on sale.  Check online and in newspapers for weekly deals.  Buy healthy items from the bulk bins when available, such as herbs, spices, flour, pasta, nuts, and dried fruit.  Buy fruits and vegetables that are in season. Prices are usually lower on in-season produce.  Look at the unit price on the price tag. Use it to compare different brands and sizes to find out which item is the best deal.  Choose healthy items that are often low-cost, such as carrots, potatoes, apples, bananas, and oranges. Dried or canned beans are a low-cost protein source.  Buy in bulk and freeze extra food. Items you can buy in bulk include meats, fish, poultry, frozen fruits, and frozen vegetables.  Avoid buying "ready-to-eat" foods, such as pre-cut fruits and vegetables and pre-made salads.  If possible, shop around to discover where you can find the best prices. Consider other retailers such as  dollar stores, larger [employer], local fruit and vegetable stands, and farmers markets.  Do not shop when you are hungry. If you shop while hungry, it may be hard to stick to your list and budget.  Resist impulse buying. Use your grocery list as your official plan for the week.  Buy a variety of vegetables and fruits by purchasing fresh, frozen, and canned items.  Look at the top and bottom shelves for deals. Foods at eye level (eye level of an adult or child) are usually more expensive.  Be efficient with your time when shopping. The more time you spend at the store, the more money you are likely to spend.  To save money when choosing more expensive foods like meats and dairy: ? Choose cheaper cuts of meat, such as bone-in chicken thighs and drumsticks instead of skinless and boneless chicken. When you are ready to prepare the chicken, you can remove the skin yourself to make it healthier. ? Choose lean meats like chicken or [place] instead of beef. ? Choose canned seafood, such as tuna, salmon, or sardines. ? Buy eggs as a low-cost source of protein. ? Buy dried beans and peas, such as lentils, split peas, or kidney beans instead of meats. Dried beans and peas are a good alternative source of protein. ? Buy the larger tubs of yogurt instead of individual-sized containers.  Choose water instead of sodas and other sweetened beverages.  Avoid buying chips, cookies, and other "junk food." These  items are usually expensive and not healthy. Cooking  Make extra food and freeze the extras in meal-sized containers or in individual portions for fast meals and snacks.  Pre-cook on days when you have extra time to prepare meals in advance. You can keep these meals in the fridge or freezer and reheat for a quick meal.  When you come home from the grocery store, wash, peel, and cut fruits and vegetables so they are ready to use and eat. This will help reduce food waste. Meal planning  Do  not eat out or get fast food. Prepare food at home.  Make a grocery list and make sure to bring it with you to the store. If you have a smart phone, you could use your phone to create your shopping list.  Plan meals and snacks according to a grocery list and budget you create.  Use leftovers in your meal plan for the week.  Look for recipes where you can cook once and make enough food for two meals.  Include budget-friendly meals like stews, casseroles, and stir-fry dishes.  Try some meatless meals or try "no cook" meals like salads.  Make sure that half your plate is filled with fruits or vegetables. Choose from fresh, frozen, or canned fruits and vegetables. If eating canned, remember to rinse them before eating. This will remove any excess salt added for packaging. Summary  Eating healthy on a budget is possible if you plan your meals according to your budget, purchase according to your budget and grocery list, and prepare food yourself.  Tips for buying more food on a limited budget include buying generic brands, using coupons only for foods you normally buy, and buying healthy items from the bulk bins when available.  Tips for buying cheaper food to replace expensive food include choosing cheaper, lean cuts of meat, and buying dried beans and peas. This information is not intended to replace advice given to you by your health care provider. Make sure you discuss any questions you have with your health care provider. Document Released: 03/02/2014 Document Revised: 06/30/2017 Document Reviewed: 06/30/2017 Elsevier Patient Education  2020 [employer].

## 2019-04-06 ENCOUNTER — Other Ambulatory Visit: Payer: Self-pay

## 2019-04-06 DIAGNOSIS — O43199 Other malformation of placenta, unspecified trimester: Secondary | ICD-10-CM

## 2019-04-06 LAB — NUSWAB BV AND CANDIDA, NAA
Candida albicans, NAA: NEGATIVE
Candida glabrata, NAA: NEGATIVE

## 2019-04-06 LAB — URINE CULTURE

## 2019-04-10 ENCOUNTER — Ambulatory Visit: Payer: Medicaid Other | Attending: Maternal Newborn

## 2019-04-10 ENCOUNTER — Telehealth: Payer: Self-pay | Admitting: Obstetrics and Gynecology

## 2019-04-10 ENCOUNTER — Ambulatory Visit: Payer: Medicaid Other

## 2019-04-10 NOTE — Telephone Encounter (Signed)
Pt has been rescheduled for 10/8 ultrasound @ 1:00p consult at 2:00. DP to call pt.

## 2019-04-10 NOTE — Telephone Encounter (Signed)
Patient is calling due to missing her schedule appointment that was schedule this morning with [facility]. Patient is wanting to get this appointment reschedule. Please contact patient. Thank you!

## 2019-04-20 ENCOUNTER — Ambulatory Visit
Admission: RE | Admit: 2019-04-20 | Discharge: 2019-04-20 | Disposition: A | Discharge status: Home / Self Care | Payer: Medicaid Other | Source: Ambulatory Visit | Attending: Maternal & Fetal Medicine | Admitting: Maternal & Fetal Medicine

## 2019-04-20 ENCOUNTER — Other Ambulatory Visit: Payer: Self-pay

## 2019-04-20 DIAGNOSIS — Z3A26 26 weeks gestation of pregnancy: Secondary | ICD-10-CM | POA: Diagnosis not present

## 2019-04-20 DIAGNOSIS — O43192 Other malformation of placenta, second trimester: Secondary | ICD-10-CM | POA: Diagnosis not present

## 2019-04-20 DIAGNOSIS — O43199 Other malformation of placenta, unspecified trimester: Secondary | ICD-10-CM

## 2019-05-01 ENCOUNTER — Telehealth: Payer: Self-pay

## 2019-05-01 NOTE — Telephone Encounter (Signed)
Patient requesting a work note. YA:[id number]

## 2019-05-01 NOTE — Telephone Encounter (Signed)
Would recommend compression socks while at work. I recommend that patient discusses accommodations with employer to see if shorter shifts might allow her to stay on the job. Would like patient to have an appointment before work note could be given as this needs to be discussed with patient.

## 2019-05-01 NOTE — Telephone Encounter (Signed)
Spoke w/patient. 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 She isn't able to take breaks or sit down and perform her job. She is requesting a note to be out of work. While discussing with patient, chart was reviewed to discover that she hasn't been seen here since 04/04/2019. She was seen at [facility] on 04/20/2019 and has a f/u with them for growth on 06/01/2019, but no f/u visits here. Please review/advise regarding work note as well as when patient needs to schedule HROB apt here.

## 2019-05-02 NOTE — Telephone Encounter (Signed)
Pt aware. She is not currently scheduled for an apt here. She is [redacted]w[redacted]d today and due for 28 wk labs at next visit. Patient advised will have front desk contact her to schedule HROB and 28 wk lab appointment within the next week at her convenience.

## 2019-05-03 ENCOUNTER — Other Ambulatory Visit: Payer: Self-pay | Admitting: Advanced Practice Midwife

## 2019-05-03 DIAGNOSIS — Z3480 Encounter for supervision of other normal pregnancy, unspecified trimester: Secondary | ICD-10-CM

## 2019-05-03 DIAGNOSIS — Z131 Encounter for screening for diabetes mellitus: Secondary | ICD-10-CM

## 2019-05-03 DIAGNOSIS — Z130 Encounter for screening for diseases of the blood and blood-forming organs and certain disorders involving the immune mechanism: Secondary | ICD-10-CM

## 2019-05-03 DIAGNOSIS — Z113 Encounter for screening for infections with a predominantly sexual mode of transmission: Secondary | ICD-10-CM

## 2019-05-03 NOTE — Telephone Encounter (Signed)
Spoke with patient and schedule for 05/10/19 at 10 am for 28 week lab and 10:50 with JEG. Could you please order labs for this appointment.

## 2019-05-03 NOTE — Progress Notes (Unsigned)
Order placed for 28 wk labs.

## 2019-05-03 NOTE — Telephone Encounter (Signed)
I placed the order for the lab. Thanks for scheduling her.

## 2019-05-10 ENCOUNTER — Encounter: Payer: Self-pay | Admitting: Advanced Practice Midwife

## 2019-05-10 ENCOUNTER — Other Ambulatory Visit: Payer: Medicaid Other

## 2019-05-10 ENCOUNTER — Ambulatory Visit (INDEPENDENT_AMBULATORY_CARE_PROVIDER_SITE_OTHER): Payer: Medicaid Other | Admitting: Advanced Practice Midwife

## 2019-05-10 ENCOUNTER — Other Ambulatory Visit: Payer: Self-pay

## 2019-05-10 VITALS — BP 126/74 | Wt 181.0 lb

## 2019-05-10 DIAGNOSIS — Z3480 Encounter for supervision of other normal pregnancy, unspecified trimester: Secondary | ICD-10-CM

## 2019-05-10 DIAGNOSIS — Z113 Encounter for screening for infections with a predominantly sexual mode of transmission: Secondary | ICD-10-CM

## 2019-05-10 DIAGNOSIS — Z131 Encounter for screening for diabetes mellitus: Secondary | ICD-10-CM

## 2019-05-10 DIAGNOSIS — Z130 Encounter for screening for diseases of the blood and blood-forming organs and certain disorders involving the immune mechanism: Secondary | ICD-10-CM

## 2019-05-10 DIAGNOSIS — Z3483 Encounter for supervision of other normal pregnancy, third trimester: Secondary | ICD-10-CM

## 2019-05-10 DIAGNOSIS — Z3A28 28 weeks gestation of pregnancy: Secondary | ICD-10-CM

## 2019-05-10 NOTE — Progress Notes (Signed)
28 week labs today.

## 2019-05-10 NOTE — Patient Instructions (Signed)
Third Trimester of Pregnancy The third trimester is from week 28 through week 40 (months 7 through 9). The third trimester is a time when the unborn baby (fetus) is growing rapidly. At the end of the ninth month, the fetus is about 20 inches in length and weighs 6-10 pounds. Body changes during your third trimester Your body will continue to go through many changes during pregnancy. The changes vary from woman to woman. During the third trimester:  Your weight will continue to increase. You can expect to gain 25-35 pounds (11-16 kg) by the end of the pregnancy.  You may begin to get stretch marks on your hips, abdomen, and breasts.  You may urinate more often because the fetus is moving lower into your pelvis and pressing on your bladder.  You may develop or continue to have heartburn. This is caused by increased hormones that slow down muscles in the digestive tract.  You may develop or continue to have constipation because increased hormones slow digestion and cause the muscles that push waste through your intestines to relax.  You may develop hemorrhoids. These are swollen veins (varicose veins) in the rectum that can itch or be painful.  You may develop swollen, bulging veins (varicose veins) in your legs.  You may have increased body aches in the pelvis, back, or thighs. This is due to weight gain and increased hormones that are relaxing your joints.  You may have changes in your hair. These can include thickening of your hair, rapid growth, and changes in texture. Some women also have hair loss during or after pregnancy, or hair that feels dry or thin. Your hair will most likely return to normal after your baby is born.  Your breasts will continue to grow and they will continue to become tender. A yellow fluid (colostrum) may leak from your breasts. This is the first milk you are producing for your baby.  Your belly button may stick out.  You may notice more swelling in your hands,  face, or ankles.  You may have increased tingling or numbness in your hands, arms, and legs. The skin on your belly may also feel numb.  You may feel short of breath because of your expanding uterus.  You may have more problems sleeping. This can be caused by the size of your belly, increased need to urinate, and an increase in your body's metabolism.  You may notice the fetus "dropping," or moving lower in your abdomen (lightening).  You may have increased vaginal discharge.  You may notice your joints feel loose and you may have pain around your pelvic bone. What to expect at prenatal visits You will have prenatal exams every 2 weeks until week 36. Then you will have weekly prenatal exams. During a routine prenatal visit:  You will be weighed to make sure you and the baby are growing normally.  Your blood pressure will be taken.  Your abdomen will be measured to track your baby's growth.  The fetal heartbeat will be listened to.  Any test results from the previous visit will be discussed.  You may have a cervical check near your due date to see if your cervix has softened or thinned (effaced).  You will be tested for Group B streptococcus. This happens between 35 and 37 weeks. Your health care provider may ask you:  What your birth plan is.  How you are feeling.  If you are feeling the baby move.  If you have had any abnormal   symptoms, such as leaking fluid, bleeding, severe headaches, or abdominal cramping.  If you are using any tobacco products, including cigarettes, chewing tobacco, and electronic cigarettes.  If you have any questions. Other tests or screenings that may be performed during your third trimester include:  Blood tests that check for low iron levels (anemia).  Fetal testing to check the health, activity level, and growth of the fetus. Testing is done if you have certain medical conditions or if there are problems during the pregnancy.  Nonstress test  (NST). This test checks the health of your baby to make sure there are no signs of problems, such as the baby not getting enough oxygen. During this test, a belt is placed around your belly. The baby is made to move, and its heart rate is monitored during movement. What is false labor? False labor is a condition in which you feel small, irregular tightenings of the muscles in the womb (contractions) that usually go away with rest, changing position, or drinking water. These are called Braxton Hicks contractions. Contractions may last for hours, days, or even weeks before true labor sets in. If contractions come at regular intervals, become more frequent, increase in intensity, or become painful, you should see your health care provider. What are the signs of labor?  Abdominal cramps.  Regular contractions that start at 10 minutes apart and become stronger and more frequent with time.  Contractions that start on the top of the uterus and spread down to the lower abdomen and back.  Increased pelvic pressure and dull back pain.  A watery or bloody mucus discharge that comes from the vagina.  Leaking of amniotic fluid. This is also known as your "water breaking." It could be a slow trickle or a gush. Let your health care provider know if it has a color or strange odor. If you have any of these signs, call your health care provider right away, even if it is before your due date. Follow these instructions at home: Medicines  Follow your health care provider's instructions regarding medicine use. Specific medicines may be either safe or unsafe to take during pregnancy.  Take a prenatal vitamin that contains at least 600 micrograms (mcg) of folic acid.  If you develop constipation, try taking a stool softener if your health care provider approves. Eating and drinking   Eat a balanced diet that includes fresh fruits and vegetables, whole grains, good sources of protein such as meat, eggs, or tofu,  and low-fat dairy. Your health care provider will help you determine the amount of weight gain that is right for you.  Avoid raw meat and uncooked cheese. These carry germs that can cause birth defects in the baby.  If you have low calcium intake from food, talk to your health care provider about whether you should take a daily calcium supplement.  Eat four or five small meals rather than three large meals a day.  Limit foods that are high in fat and processed sugars, such as fried and sweet foods.  To prevent constipation: ? Drink enough fluid to keep your urine clear or pale yellow. ? Eat foods that are high in fiber, such as fresh fruits and vegetables, whole grains, and beans. Activity  Exercise only as directed by your health care provider. Most women can continue their usual exercise routine during pregnancy. Try to exercise for 30 minutes at least 5 days a week. Stop exercising if you experience uterine contractions.  Avoid heavy lifting.  Do   not exercise in extreme heat or humidity, or at high altitudes.  Wear low-heel, comfortable shoes.  Practice good posture.  You may continue to have sex unless your health care provider tells you otherwise. Relieving pain and discomfort  Take frequent breaks and rest with your legs elevated if you have leg cramps or low back pain.  Take warm sitz baths to soothe any pain or discomfort caused by hemorrhoids. Use hemorrhoid cream if your health care provider approves.  Wear a good support bra to prevent discomfort from breast tenderness.  If you develop varicose veins: ? Wear support pantyhose or compression stockings as told by your healthcare provider. ? Elevate your feet for 15 minutes, 3-4 times a day. Prenatal care  Write down your questions. Take them to your prenatal visits.  Keep all your prenatal visits as told by your health care provider. This is important. Safety  Wear your seat belt at all times when driving.  Make  a list of emergency phone numbers, including numbers for family, friends, the hospital, and police and fire departments. General instructions  Avoid cat litter boxes and soil used by cats. These carry germs that can cause birth defects in the baby. If you have a cat, ask someone to clean the litter box for you.  Do not travel far distances unless it is absolutely necessary and only with the approval of your health care provider.  Do not use hot tubs, steam rooms, or saunas.  Do not drink alcohol.  Do not use any products that contain nicotine or tobacco, such as cigarettes and e-cigarettes. If you need help quitting, ask your health care provider.  Do not use any medicinal herbs or unprescribed drugs. These chemicals affect the formation and growth of the baby.  Do not douche or use tampons or scented sanitary pads.  Do not cross your legs for long periods of time.  To prepare for the arrival of your baby: ? Take prenatal classes to understand, practice, and ask questions about labor and delivery. ? Make a trial run to the hospital. ? Visit the hospital and tour the maternity area. ? Arrange for maternity or paternity leave through employers. ? Arrange for family and friends to take care of pets while you are in the hospital. ? Purchase a rear-facing car seat and make sure you know how to install it in your car. ? Pack your hospital bag. ? Prepare the baby's nursery. Make sure to remove all pillows and stuffed animals from the baby's crib to prevent suffocation.  Visit your dentist if you have not gone during your pregnancy. Use a soft toothbrush to brush your teeth and be gentle when you floss. Contact a health care provider if:  You are unsure if you are in labor or if your water has broken.  You become dizzy.  You have mild pelvic cramps, pelvic pressure, or nagging pain in your abdominal area.  You have lower back pain.  You have persistent nausea, vomiting, or diarrhea.   You have an unusual or bad smelling vaginal discharge.  You have pain when you urinate. Get help right away if:  Your water breaks before 37 weeks.  You have regular contractions less than 5 minutes apart before 37 weeks.  You have a fever.  You are leaking fluid from your vagina.  You have spotting or bleeding from your vagina.  You have severe abdominal pain or cramping.  You have rapid weight loss or weight gain.  You have  shortness of breath with chest pain.  You notice sudden or extreme swelling of your face, hands, ankles, feet, or legs.  Your baby makes fewer than 10 movements in 2 hours.  You have severe headaches that do not go away when you take medicine.  You have vision changes. Summary  The third trimester is from week 28 through week 40, months 7 through 9. The third trimester is a time when the unborn baby (fetus) is growing rapidly.  During the third trimester, your discomfort may increase as you and your baby continue to gain weight. You may have abdominal, leg, and back pain, sleeping problems, and an increased need to urinate.  During the third trimester your breasts will keep growing and they will continue to become tender. A yellow fluid (colostrum) may leak from your breasts. This is the first milk you are producing for your baby.  False labor is a condition in which you feel small, irregular tightenings of the muscles in the womb (contractions) that eventually go away. These are called Braxton Hicks contractions. Contractions may last for hours, days, or even weeks before true labor sets in.  Signs of labor can include: abdominal cramps; regular contractions that start at 10 minutes apart and become stronger and more frequent with time; watery or bloody mucus discharge that comes from the vagina; increased pelvic pressure and dull back pain; and leaking of amniotic fluid. This information is not intended to replace advice given to you by your health  care provider. Make sure you discuss any questions you have with your health care provider. Document Released: 06/23/2001 Document Revised: 10/20/2018 Document Reviewed: 08/04/2016 Elsevier Patient Education  2020 [employer]  

## 2019-05-10 NOTE — Progress Notes (Signed)
Routine Prenatal Care Visit  Subjective  Brandi Watkins is a 23 y.o. G2P0010 at [redacted]w[redacted]d being seen today for ongoing prenatal care.  She is currently monitored for the following issues for this low-risk pregnancy and has Breast fibroadenoma in female, left; Supervision of other normal pregnancy, antepartum; and Marginal insertion of umbilical cord affecting management of mother on their problem list.  ----------------------------------------------------------------------------------- Patient reports having difficulty tolerating her 6 hour weekend shifts at [employer] shop since she's on her feet the whole time. Her knees hurt and ankles are swollen at the end of the shift. She will consider work restriction note or quitting that job. She has another work from home job.   Contractions: Not present. Vag. Bleeding: None.  Movement: Present. Leaking Fluid denies.  ----------------------------------------------------------------------------------- The following portions of the patient's history were reviewed and updated as appropriate: allergies, current medications, past family history, past medical history, past social history, past surgical history and problem list. Problem list updated.  Objective  Blood pressure 126/74, weight 181 lb (82.1 kg), last menstrual period 11/23/2018, unknown if currently breastfeeding. Pregravid weight 155 lb (70.3 kg) Total Weight Gain 26 lb (11.8 kg) Urinalysis: Urine Protein    Urine Glucose    Fetal Status: Fetal Heart Rate (bpm): 150 Fundal Height: 29 cm Movement: Present     General:  Alert, oriented and cooperative. Patient is in no acute distress.  Skin: Skin is warm and dry. No rash noted.   Cardiovascular: Normal heart rate noted  Respiratory: Normal respiratory effort, no problems with respiration noted  Abdomen: Soft, gravid, appropriate for gestational age. Pain/Pressure: Absent     Pelvic:  Cervical exam deferred        Extremities: Normal range of  motion.  Edema: None  Mental Status: Normal mood and affect. Normal behavior. Normal judgment and thought content.   Assessment   23 y.o. G2P0010 at [redacted]w[redacted]d by  07/27/2019, by Other Basis presenting for routine prenatal visit  Plan   pregnancy2 Problems (from 11/23/18 to present)    Problem Noted Resolved   Supervision of other normal pregnancy, antepartum 02/08/2019 by Rexene Agent, CNM No   Overview Addendum 04/04/2019  9:59 AM by Homero Fellers, MD    Clinic [facility] Prenatal Labs  Dating Ultrasound 7w 6d Blood type: O/Positive/-- (06/11 1407)   Genetic Screen 1 Screen:    AFP:     Quad:     NIPS: Antibody:Negative (06/11 1407)  Anatomic [place] Complete, growth less than 5% Rubella: 15.80 (06/11 1407) Varicella: Immune  GTT Third trimester:  RPR: Non Reactive (06/11 1407)   Rhogam  not needed HBsAg: Negative (06/11 1407)   TDaP vaccine                        Flu Shot:Declines HIV: Non Reactive (06/11 1407)   Baby Food                                GBS:   Contraception  Pap: LSIL 01/12/2019  CBB     CS/VBAC    Support Person                  Preterm labor symptoms and general obstetric precautions including but not limited to vaginal bleeding, contractions, leaking of fluid and fetal movement were reviewed in detail with the patient. Please refer to After Visit Summary for other counseling recommendations.  Return in about 2 weeks (around 05/24/2019) for rob.  Rod Can, CNM 05/10/2019 11:13 AM

## 2019-05-11 LAB — 28 WEEK RH+PANEL
Basophils Absolute: 0 10*3/uL (ref 0.0–0.2)
Basos: 0 %
EOS (ABSOLUTE): 0.1 10*3/uL (ref 0.0–0.4)
Eos: 1 %
Gestational Diabetes Screen: 74 mg/dL (ref 65–139)
HIV Screen 4th Generation wRfx: NONREACTIVE
Hematocrit: 35.5 % (ref 34.0–46.6)
Hemoglobin: 12.3 g/dL (ref 11.1–15.9)
Immature Grans (Abs): 0.1 10*3/uL (ref 0.0–0.1)
Immature Granulocytes: 1 %
Lymphocytes Absolute: 1.8 10*3/uL (ref 0.7–3.1)
Lymphs: 15 %
MCH: 32.4 pg (ref 26.6–33.0)
MCHC: 34.6 g/dL (ref 31.5–35.7)
MCV: 93 fL (ref 79–97)
Monocytes Absolute: 0.8 10*3/uL (ref 0.1–0.9)
Monocytes: 7 %
Neutrophils Absolute: 9.4 10*3/uL — ABNORMAL HIGH (ref 1.4–7.0)
Neutrophils: 76 %
Platelets: 169 10*3/uL (ref 150–450)
RBC: 3.8 x10E6/uL (ref 3.77–5.28)
RDW: 12.6 % (ref 11.7–15.4)
RPR Ser Ql: NONREACTIVE
WBC: 12.3 10*3/uL — ABNORMAL HIGH (ref 3.4–10.8)

## 2019-05-24 ENCOUNTER — Ambulatory Visit (INDEPENDENT_AMBULATORY_CARE_PROVIDER_SITE_OTHER): Payer: Medicaid Other | Admitting: Advanced Practice Midwife

## 2019-05-24 ENCOUNTER — Encounter: Payer: Self-pay | Admitting: Advanced Practice Midwife

## 2019-05-24 ENCOUNTER — Other Ambulatory Visit: Payer: Self-pay

## 2019-05-24 VITALS — BP 122/74 | Wt 183.0 lb

## 2019-05-24 DIAGNOSIS — Z3483 Encounter for supervision of other normal pregnancy, third trimester: Secondary | ICD-10-CM

## 2019-05-24 DIAGNOSIS — Z23 Encounter for immunization: Secondary | ICD-10-CM

## 2019-05-24 DIAGNOSIS — R87612 Low grade squamous intraepithelial lesion on cytologic smear of cervix (LGSIL): Secondary | ICD-10-CM | POA: Insufficient documentation

## 2019-05-24 DIAGNOSIS — Z3A30 30 weeks gestation of pregnancy: Secondary | ICD-10-CM

## 2019-05-24 DIAGNOSIS — Z3480 Encounter for supervision of other normal pregnancy, unspecified trimester: Secondary | ICD-10-CM

## 2019-05-24 NOTE — Progress Notes (Signed)
Routine Prenatal Care Visit  Subjective  Brandi Watkins is a 23 y.o. G2P0010 at [redacted]w[redacted]d being seen today for ongoing prenatal care.  She is currently monitored for the following issues for this low-risk pregnancy and has Breast fibroadenoma in female, left; Supervision of other normal pregnancy, antepartum; Marginal insertion of umbilical cord affecting management of mother; and Low grade squamous intraepithelial lesion (LGSIL) on cervical Pap smear on their problem list.  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 She has noticed a couple of bumps near her vagina that have been there for about a month. She denies any pain associated with the bumps.  Reminded patient of need for colposcopy after pregnancy. (She was told by [facility] at diagnosis to have colpo after pregnancy). Contractions: Not present. Vag. Bleeding: None.  Movement: Present. Leaking Fluid denies.  ----------------------------------------------------------------------------------- The following portions of the patient's history were reviewed and updated as appropriate: allergies, current medications, past family history, past medical history, past social history, past surgical history and problem list. Problem list updated.  Objective  Blood pressure 122/74, weight 183 lb (83 kg), last menstrual period 11/23/2018 Pregravid weight 155 lb (70.3 kg) Total Weight Gain 28 lb (12.7 kg) Urinalysis: Urine Protein    Urine Glucose    Fetal Status: Fetal Heart Rate (bpm): 145 Fundal Height: 32 cm Movement: Present     General:  Alert, oriented and cooperative. Patient is in no acute distress.  Skin: Skin is warm and dry. No rash noted.   Cardiovascular: Normal heart rate noted  Respiratory: Normal respiratory effort, no problems with respiration noted  Abdomen: Soft, gravid, appropriate for gestational age.  Pain/Pressure: Absent     Pelvic:  3 condyloma noted: 1 perianal and 2 that are superior to clitoral hood. No other lesions seen.        Extremities: Normal range of motion.     Mental Status: Normal mood and affect. Normal behavior. Normal judgment and thought content.   Assessment   23 y.o. G2P0010 at [redacted]w[redacted]d by  07/27/2019, by Other Basis presenting for routine prenatal visit  Plan   pregnancy2 Problems (from 11/23/18 to present)    Problem Noted Resolved   Low grade squamous intraepithelial lesion (LGSIL) on cervical Pap smear 05/24/2019 by Rod Can, CNM No   Supervision of other normal pregnancy, antepartum 02/08/2019 by Rexene Agent, CNM No   Overview Addendum 05/24/2019 10:45 AM by Rod Can, [place] Prenatal Labs  Dating Ultrasound 7w 6d Blood type: O/Positive/-- (06/11 1407)   Genetic Screen 1 Screen:    AFP:     Quad:     NIPS: Antibody:Negative (06/11 1407)  Anatomic [place] Complete, growth less than 5% Rubella: 15.80 (06/11 1407) Varicella: Immune  GTT Third trimester:  RPR: Non Reactive (06/11 1407)   Rhogam  not needed HBsAg: Negative (06/11 1407)   Vaccines TDAP: 05/24/19                      Flu Shot:Declines HIV: Non Reactive (06/11 1407)   Baby Food                                GBS:   Contraception  Pap: LSIL 01/12/2019  CBB     CS/VBAC    Support Person  TDAP today   Preterm labor symptoms and general obstetric precautions including but not limited to vaginal bleeding, contractions, leaking of fluid and fetal movement were reviewed in detail with the patient. Schedule next visit with MD to discuss possible treatment for condyloma during pregnancy  Return in about 2 weeks (around 06/07/2019) for rob.   Rod Can, CNM 05/24/2019 10:47 AM

## 2019-05-24 NOTE — Progress Notes (Signed)
No vb. No lof. TDAP at [place] per pt.  

## 2019-05-25 ENCOUNTER — Other Ambulatory Visit: Payer: Self-pay | Admitting: Maternal & Fetal Medicine

## 2019-05-29 ENCOUNTER — Other Ambulatory Visit: Payer: Self-pay | Admitting: Maternal & Fetal Medicine

## 2019-05-29 ENCOUNTER — Other Ambulatory Visit: Payer: Self-pay

## 2019-05-29 DIAGNOSIS — Z3689 Encounter for other specified antenatal screening: Secondary | ICD-10-CM

## 2019-06-01 ENCOUNTER — Inpatient Hospital Stay: Admission: RE | Admit: 2019-06-01 | Payer: Medicaid Other | Source: Ambulatory Visit

## 2019-06-05 ENCOUNTER — Ambulatory Visit
Admission: RE | Admit: 2019-06-05 | Discharge: 2019-06-05 | Disposition: A | Discharge status: Home / Self Care | Payer: Medicaid Other | Source: Ambulatory Visit | Attending: Obstetrics and Gynecology | Admitting: Obstetrics and Gynecology

## 2019-06-05 ENCOUNTER — Other Ambulatory Visit: Payer: Self-pay

## 2019-06-05 DIAGNOSIS — Z3689 Encounter for other specified antenatal screening: Secondary | ICD-10-CM | POA: Diagnosis not present

## 2019-06-05 DIAGNOSIS — R87612 Low grade squamous intraepithelial lesion on cytologic smear of cervix (LGSIL): Secondary | ICD-10-CM

## 2019-06-05 DIAGNOSIS — Z3480 Encounter for supervision of other normal pregnancy, unspecified trimester: Secondary | ICD-10-CM

## 2019-06-05 DIAGNOSIS — Z3A32 32 weeks gestation of pregnancy: Secondary | ICD-10-CM | POA: Diagnosis not present

## 2019-06-07 ENCOUNTER — Encounter: Payer: Medicaid Other | Admitting: Obstetrics and Gynecology

## 2019-06-16 ENCOUNTER — Other Ambulatory Visit: Payer: Self-pay

## 2019-06-16 ENCOUNTER — Ambulatory Visit (INDEPENDENT_AMBULATORY_CARE_PROVIDER_SITE_OTHER): Payer: Medicaid Other | Admitting: Obstetrics and Gynecology

## 2019-06-16 ENCOUNTER — Encounter: Payer: Self-pay | Admitting: Obstetrics and Gynecology

## 2019-06-16 VITALS — BP 124/78 | Wt 188.0 lb

## 2019-06-16 DIAGNOSIS — Z3A34 34 weeks gestation of pregnancy: Secondary | ICD-10-CM

## 2019-06-16 DIAGNOSIS — O43193 Other malformation of placenta, third trimester: Secondary | ICD-10-CM

## 2019-06-16 DIAGNOSIS — Z3480 Encounter for supervision of other normal pregnancy, unspecified trimester: Secondary | ICD-10-CM

## 2019-06-16 DIAGNOSIS — O43199 Other malformation of placenta, unspecified trimester: Secondary | ICD-10-CM

## 2019-06-16 NOTE — Progress Notes (Signed)
Routine Prenatal Care Visit  Subjective  Brandi Watkins is a 23 y.o. G2P0010 at [redacted]w[redacted]d being seen today for ongoing prenatal care.  She is currently monitored for the following issues for this low-risk pregnancy and has Breast fibroadenoma in female, left; Supervision of other normal pregnancy, antepartum; Marginal insertion of umbilical cord affecting management of mother; and Low grade squamous intraepithelial lesion (LGSIL) on cervical Pap smear on their problem list.  ----------------------------------------------------------------------------------- Patient reports no complaints.   Contractions: Not present. Vag. Bleeding: None.  Movement: Present. Leaking Fluid denies.  ----------------------------------------------------------------------------------- The following portions of the patient's history were reviewed and updated as appropriate: allergies, current medications, past family history, past medical history, past social history, past surgical history and problem list. Problem list updated.  Objective  Blood pressure 124/78, weight 188 lb (85.3 kg), last menstrual period 11/23/2018, unknown if currently breastfeeding. Pregravid weight 155 lb (70.3 kg) Total Weight Gain 33 lb (15 kg) Urinalysis: Urine Protein    Urine Glucose    Fetal Status: Fetal Heart Rate (bpm): 135 Fundal Height: 34 cm Movement: Present     General:  Alert, oriented and cooperative. Patient is in no acute distress.  Skin: Skin is warm and dry. No rash noted.   Cardiovascular: Normal heart rate noted  Respiratory: Normal respiratory effort, no problems with respiration noted  Abdomen: Soft, gravid, appropriate for gestational age. Pain/Pressure: Absent     Pelvic:  Cervical exam deferred        Extremities: Normal range of motion.     Mental Status: Normal mood and affect. Normal behavior. Normal judgment and thought content.   Assessment   23 y.o. G2P0010 at [redacted]w[redacted]d by  07/27/2019, by Other Basis presenting  for routine prenatal visit  Plan   pregnancy2 Problems (from 11/23/18 to present)    Problem Noted Resolved   Low grade squamous intraepithelial lesion (LGSIL) on cervical Pap smear 05/24/2019 by Rod Can, CNM No   Supervision of other normal pregnancy, antepartum 02/08/2019 by Rexene Agent, CNM No   Overview Addendum 05/24/2019 10:45 AM by Rod Can, [place] Prenatal Labs  Dating Ultrasound 7w 6d Blood type: O/Positive/-- (06/11 1407)   Genetic Screen 1 Screen:    AFP:     Quad:     NIPS: Antibody:Negative (06/11 1407)  Anatomic [place] Complete, growth less than 5% Rubella: 15.80 (06/11 1407) Varicella: Immune  GTT Third trimester:  RPR: Non Reactive (06/11 1407)   Rhogam  not needed HBsAg: Negative (06/11 1407)   Vaccines TDAP: 05/24/19                      Flu Shot:Declines HIV: Non Reactive (06/11 1407)   Baby Food                                GBS:   Contraception  Pap: LSIL 01/12/2019  CBB     CS/VBAC    Support Person                  Preterm labor symptoms and general obstetric precautions including but not limited to vaginal bleeding, contractions, leaking of fluid and fetal movement were reviewed in detail with the patient. Please refer to After Visit Summary for other counseling recommendations.   Return in about 2 weeks (around 06/30/2019) for Routine Prenatal Appointment.  Prentice Docker, MD, [facility], [place]  06/16/2019 9:53 AM

## 2019-06-19 ENCOUNTER — Telehealth: Payer: Self-pay | Admitting: Obstetrics and Gynecology

## 2019-06-19 NOTE — Telephone Encounter (Signed)
Patient is returning missed call. Please advise 

## 2019-06-19 NOTE — Telephone Encounter (Signed)
Patient is  Calling needing to speak with someone about her short term disability. Please advise

## 2019-06-20 NOTE — Telephone Encounter (Signed)
Spoke with pt about FMLA paperwork has been sent over. Pt stated it was completed by someone here without it be actually complete and there was no provider signature. I have advised pt she was not in my FMLA spreadsheet nor is there a note in her chart that it was completed. Brandi Watkins has sent all new paperwork to fill out

## 2019-06-21 NOTE — Telephone Encounter (Signed)
Patient is calling to speak with someone about her FMLA. Please advise

## 2019-06-23 NOTE — Telephone Encounter (Signed)
I have contacted pt in regards to her FMLA. Pt is wanting to be taken out of work starting on 06/19/19 to 09/09/18. I am unable to take her out of work for that long without a medical reason and an okay from a provider.

## 2019-07-03 ENCOUNTER — Ambulatory Visit (INDEPENDENT_AMBULATORY_CARE_PROVIDER_SITE_OTHER): Payer: Medicaid Other | Admitting: Obstetrics and Gynecology

## 2019-07-03 ENCOUNTER — Other Ambulatory Visit: Payer: Self-pay

## 2019-07-03 ENCOUNTER — Other Ambulatory Visit (HOSPITAL_COMMUNITY)
Admission: RE | Admit: 2019-07-03 | Discharge: 2019-07-03 | Disposition: A | Discharge status: Home / Self Care | Payer: Medicaid Other | Source: Ambulatory Visit | Attending: Obstetrics and Gynecology | Admitting: Obstetrics and Gynecology

## 2019-07-03 ENCOUNTER — Encounter: Payer: Self-pay | Admitting: Obstetrics and Gynecology

## 2019-07-03 VITALS — BP 122/80 | Wt 195.0 lb

## 2019-07-03 DIAGNOSIS — Z3A36 36 weeks gestation of pregnancy: Secondary | ICD-10-CM | POA: Diagnosis present

## 2019-07-03 DIAGNOSIS — O43199 Other malformation of placenta, unspecified trimester: Secondary | ICD-10-CM

## 2019-07-03 DIAGNOSIS — Z3480 Encounter for supervision of other normal pregnancy, unspecified trimester: Secondary | ICD-10-CM | POA: Diagnosis present

## 2019-07-03 DIAGNOSIS — O43193 Other malformation of placenta, third trimester: Secondary | ICD-10-CM

## 2019-07-03 NOTE — Progress Notes (Signed)
Routine Prenatal Care Visit  Subjective  Brandi Watkins is a 23 y.o. Watkins at [redacted]w[redacted]d being seen today for ongoing prenatal care.  She is currently monitored for the following issues for this low-risk pregnancy and has Breast fibroadenoma in female, left; Supervision of other normal pregnancy, antepartum; Marginal insertion of umbilical cord affecting management of mother; and Low grade squamous intraepithelial lesion (LGSIL) on cervical Pap smear on their problem list.  ----------------------------------------------------------------------------------- Patient reports no complaints.   Contractions: Not present. Vag. Bleeding: None.  Movement: Present. Leaking Fluid denies.  ----------------------------------------------------------------------------------- The following portions of the patient's history were reviewed and updated as appropriate: allergies, current medications, past family history, past medical history, past social history, past surgical history and problem list. Problem list updated.  Objective  Blood pressure 122/80, weight 195 lb (88.5 kg), last menstrual period 11/23/2018, unknown if currently breastfeeding. Pregravid weight 155 lb (70.3 kg) Total Weight Gain 40 lb (18.1 kg) Urinalysis: Urine Protein    Urine Glucose    Fetal Status: Fetal Heart Rate (bpm): 140 Fundal Height: 36 cm Movement: Present  Presentation: Vertex  General:  Alert, oriented and cooperative. Patient is in no acute distress.  Skin: Skin is warm and dry. No rash noted.   Cardiovascular: Normal heart rate noted  Respiratory: Normal respiratory effort, no problems with respiration noted  Abdomen: Soft, gravid, appropriate for gestational age. Pain/Pressure: Absent     Pelvic:  Cervical exam deferred        Extremities: Normal range of motion.  Edema: None  Mental Status: Normal mood and affect. Normal behavior. Normal judgment and thought content.   Assessment   Brandi Watkins at [redacted]w[redacted]d by   07/27/2019, by Other Basis presenting for routine prenatal visit  Plan   pregnancy2 Problems (from 11/23/18 to present)    Problem Noted Resolved   Low grade squamous intraepithelial lesion (LGSIL) on cervical Pap smear 05/24/2019 by Rod Can, CNM No   Supervision of other normal pregnancy, antepartum 02/08/2019 by Rexene Agent, CNM No   Overview Addendum 05/24/2019 10:45 AM by Rod Can, [place] Prenatal Labs  Dating Ultrasound 7w 6d Blood type: O/Positive/-- (06/11 1407)   Genetic Screen 1 Screen:    AFP:     Quad:     NIPS: Antibody:Negative (06/11 1407)  Anatomic [place] Complete, growth less than 5% Rubella: 15.80 (06/11 1407) Varicella: Immune  GTT Third trimester:  RPR: Non Reactive (06/11 1407)   Rhogam  not needed HBsAg: Negative (06/11 1407)   Vaccines TDAP: 05/24/19                      Flu Shot:Declines HIV: Non Reactive (06/11 1407)   Baby Food                                GBS:   Contraception  Pap: LSIL 01/12/2019  CBB     CS/VBAC    Support Person                  Preterm labor symptoms and general obstetric precautions including but not limited to vaginal bleeding, contractions, leaking of fluid and fetal movement were reviewed in detail with the patient. Please refer to After Visit Summary for other counseling recommendations.   Return in about 1 week (around 07/10/2019).  Prentice Docker, MD, [facility], [facility] 07/03/2019 9:35 AM

## 2019-07-05 LAB — CERVICOVAGINAL ANCILLARY ONLY
Chlamydia: NEGATIVE
Comment: NEGATIVE
Comment: NORMAL
Neisseria Gonorrhea: NEGATIVE

## 2019-07-05 LAB — STREP GP B NAA: Strep Gp B NAA: POSITIVE — AB

## 2019-07-10 ENCOUNTER — Ambulatory Visit (INDEPENDENT_AMBULATORY_CARE_PROVIDER_SITE_OTHER): Payer: Medicaid Other | Admitting: Certified Nurse Midwife

## 2019-07-10 ENCOUNTER — Inpatient Hospital Stay
Admission: EM | Admit: 2019-07-10 | Discharge: 2019-07-14 | DRG: 787 | Disposition: A | Discharge status: Home / Self Care | Payer: Medicaid Other | Attending: Certified Nurse Midwife | Admitting: Certified Nurse Midwife

## 2019-07-10 ENCOUNTER — Other Ambulatory Visit: Payer: Self-pay

## 2019-07-10 ENCOUNTER — Encounter: Payer: Self-pay | Admitting: Obstetrics and Gynecology

## 2019-07-10 VITALS — BP 138/80 | Wt 198.0 lb

## 2019-07-10 DIAGNOSIS — D6959 Other secondary thrombocytopenia: Secondary | ICD-10-CM | POA: Diagnosis present

## 2019-07-10 DIAGNOSIS — O1404 Mild to moderate pre-eclampsia, complicating childbirth: Principal | ICD-10-CM | POA: Diagnosis present

## 2019-07-10 DIAGNOSIS — D62 Acute posthemorrhagic anemia: Secondary | ICD-10-CM | POA: Diagnosis not present

## 2019-07-10 DIAGNOSIS — O134 Gestational [pregnancy-induced] hypertension without significant proteinuria, complicating childbirth: Secondary | ICD-10-CM

## 2019-07-10 DIAGNOSIS — Z3689 Encounter for other specified antenatal screening: Secondary | ICD-10-CM

## 2019-07-10 DIAGNOSIS — R87612 Low grade squamous intraepithelial lesion on cytologic smear of cervix (LGSIL): Secondary | ICD-10-CM

## 2019-07-10 DIAGNOSIS — R030 Elevated blood-pressure reading, without diagnosis of hypertension: Secondary | ICD-10-CM | POA: Diagnosis present

## 2019-07-10 DIAGNOSIS — O1490 Unspecified pre-eclampsia, unspecified trimester: Secondary | ICD-10-CM | POA: Diagnosis present

## 2019-07-10 DIAGNOSIS — O1493 Unspecified pre-eclampsia, third trimester: Secondary | ICD-10-CM

## 2019-07-10 DIAGNOSIS — O99824 Streptococcus B carrier state complicating childbirth: Secondary | ICD-10-CM | POA: Diagnosis present

## 2019-07-10 DIAGNOSIS — O43123 Velamentous insertion of umbilical cord, third trimester: Secondary | ICD-10-CM | POA: Diagnosis present

## 2019-07-10 DIAGNOSIS — O368390 Maternal care for abnormalities of the fetal heart rate or rhythm, unspecified trimester, not applicable or unspecified: Secondary | ICD-10-CM

## 2019-07-10 DIAGNOSIS — O9081 Anemia of the puerperium: Secondary | ICD-10-CM | POA: Diagnosis not present

## 2019-07-10 DIAGNOSIS — O9912 Other diseases of the blood and blood-forming organs and certain disorders involving the immune mechanism complicating childbirth: Secondary | ICD-10-CM | POA: Diagnosis present

## 2019-07-10 DIAGNOSIS — Z20822 Contact with and (suspected) exposure to covid-19: Secondary | ICD-10-CM | POA: Diagnosis present

## 2019-07-10 DIAGNOSIS — Z3480 Encounter for supervision of other normal pregnancy, unspecified trimester: Secondary | ICD-10-CM

## 2019-07-10 DIAGNOSIS — Z3A37 37 weeks gestation of pregnancy: Secondary | ICD-10-CM

## 2019-07-10 DIAGNOSIS — Z98891 History of uterine scar from previous surgery: Secondary | ICD-10-CM

## 2019-07-10 HISTORY — DX: Unspecified pre-eclampsia, third trimester: O14.93

## 2019-07-10 LAB — CBC
HCT: 35.2 % — ABNORMAL LOW (ref 36.0–46.0)
Hemoglobin: 11.6 g/dL — ABNORMAL LOW (ref 12.0–15.0)
MCH: 31.6 pg (ref 26.0–34.0)
MCHC: 33 g/dL (ref 30.0–36.0)
MCV: 95.9 fL (ref 80.0–100.0)
Platelets: 114 10*3/uL — ABNORMAL LOW (ref 150–400)
RBC: 3.67 MIL/uL — ABNORMAL LOW (ref 3.87–5.11)
RDW: 13.9 % (ref 11.5–15.5)
WBC: 11.3 10*3/uL — ABNORMAL HIGH (ref 4.0–10.5)
nRBC: 0 % (ref 0.0–0.2)

## 2019-07-10 LAB — COMPREHENSIVE METABOLIC PANEL
ALT: 12 U/L (ref 0–44)
AST: 20 U/L (ref 15–41)
Albumin: 3.1 g/dL — ABNORMAL LOW (ref 3.5–5.0)
Alkaline Phosphatase: 291 U/L — ABNORMAL HIGH (ref 38–126)
Anion gap: 8 (ref 5–15)
BUN: 8 mg/dL (ref 6–20)
CO2: 19 mmol/L — ABNORMAL LOW (ref 22–32)
Calcium: 8.8 mg/dL — ABNORMAL LOW (ref 8.9–10.3)
Chloride: 109 mmol/L (ref 98–111)
Creatinine, Ser: 0.48 mg/dL (ref 0.44–1.00)
GFR calc Af Amer: >60 mL/min (ref >60)
GFR calc non Af Amer: >60 mL/min (ref >60)
Glucose, Bld: 105 mg/dL — ABNORMAL HIGH (ref 70–99)
Potassium: 3.9 mmol/L (ref 3.5–5.1)
Sodium: 136 mmol/L (ref 135–145)
Total Bilirubin: 0.4 mg/dL (ref 0.3–1.2)
Total Protein: 6 g/dL — ABNORMAL LOW (ref 6.5–8.1)

## 2019-07-10 LAB — POCT URINALYSIS DIPSTICK OB: Glucose, UA: NEGATIVE

## 2019-07-10 LAB — PROTEIN / CREATININE RATIO, URINE
Creatinine, Urine: 118 mg/dL
Protein Creatinine Ratio: 0.25 mg/mg{Cre} — ABNORMAL HIGH (ref 0.00–0.15)
Total Protein, Urine: 30 mg/dL

## 2019-07-10 MED ORDER — ONDANSETRON HCL 4 MG/2ML IJ SOLN
4.0000 mg | Freq: Four times a day (QID) | INTRAMUSCULAR | Status: DC | PRN
  Administered 2019-07-12: 4 mg via INTRAVENOUS

## 2019-07-10 MED ORDER — LIDOCAINE HCL (PF) 1 % IJ SOLN: 30.0000 mL | INTRAMUSCULAR | Status: DC | PRN

## 2019-07-10 MED ORDER — TERBUTALINE SULFATE 1 MG/ML IJ SOLN
0.2500 mg | Freq: Once | INTRAMUSCULAR | Status: AC | PRN
  Administered 2019-07-12: 0.25 mg via SUBCUTANEOUS
  Filled 2019-07-10: qty 1

## 2019-07-10 MED ORDER — OXYTOCIN 40 UNITS IN NORMAL SALINE INFUSION - SIMPLE MED
2.5000 [IU]/h | INTRAVENOUS | Status: DC
  Administered 2019-07-12: 40 [IU]/h via INTRAVENOUS
  Filled 2019-07-10: qty 1000

## 2019-07-10 MED ORDER — HYDRALAZINE HCL 20 MG/ML IJ SOLN: 10.0000 mg | INTRAMUSCULAR | Status: DC | PRN

## 2019-07-10 MED ORDER — BUTORPHANOL TARTRATE 1 MG/ML IJ SOLN
1.0000 mg | INTRAMUSCULAR | Status: DC | PRN
  Administered 2019-07-11 (×2): 1 mg via INTRAVENOUS
  Filled 2019-07-10 (×2): qty 1

## 2019-07-10 MED ORDER — HYDROCORTISONE 1 % EX CREA: 1.0000 "application " | TOPICAL_CREAM | Freq: Four times a day (QID) | CUTANEOUS | 0 refills | Status: DC

## 2019-07-10 MED ORDER — SODIUM CHLORIDE 0.9 % IV SOLN
5.0000 10*6.[IU] | Freq: Once | INTRAVENOUS | Status: AC
  Administered 2019-07-11: 5 10*6.[IU] via INTRAVENOUS
  Filled 2019-07-10: qty 5

## 2019-07-10 MED ORDER — LACTATED RINGERS IV SOLN: INTRAVENOUS | Status: DC

## 2019-07-10 MED ORDER — LACTATED RINGERS IV SOLN
500.0000 mL | INTRAVENOUS | Status: DC | PRN
  Administered 2019-07-12: 1000 mL via INTRAVENOUS

## 2019-07-10 MED ORDER — MISOPROSTOL 25 MCG QUARTER TABLET
25.0000 ug | ORAL_TABLET | ORAL | Status: DC
  Administered 2019-07-11: 06:00:00 25 ug via VAGINAL
  Filled 2019-07-10 (×3): qty 1

## 2019-07-10 MED ORDER — LABETALOL HCL 5 MG/ML IV SOLN: 40.0000 mg | INTRAVENOUS | Status: DC | PRN

## 2019-07-10 MED ORDER — LABETALOL HCL 5 MG/ML IV SOLN: 80.0000 mg | INTRAVENOUS | Status: DC | PRN

## 2019-07-10 MED ORDER — ACETAMINOPHEN 325 MG PO TABS: 650.0000 mg | ORAL_TABLET | ORAL | Status: DC | PRN

## 2019-07-10 MED ORDER — OXYTOCIN BOLUS FROM INFUSION: 500.0000 mL | Freq: Once | INTRAVENOUS | Status: DC

## 2019-07-10 MED ORDER — LABETALOL HCL 5 MG/ML IV SOLN: 20.0000 mg | INTRAVENOUS | Status: DC | PRN

## 2019-07-10 MED ORDER — PENICILLIN G 3 MILLION UNITS IVPB - SIMPLE MED
3.0000 10*6.[IU] | INTRAVENOUS | Status: DC
  Administered 2019-07-11 – 2019-07-12 (×6): 3 10*6.[IU] via INTRAVENOUS
  Filled 2019-07-10 (×6): qty 100

## 2019-07-10 NOTE — H&P (Addendum)
OB History & Physical   History of Present Illness:  Chief Complaint: elevated blood pressure in clinic and swelling in feet and feeling tired  HPI:  Brandi Watkins is a 23 y.o. G2P0010 female at [redacted]w[redacted]d dated by 6 week ultrasound.  Her pregnancy has been complicated by marginal cord insertion, LGSIL cervical PAP smear, preeclampsia at term.    She denies headache or epigastric pain. She admits having some spots in her vision.   She denies contractions.   She denies leakage of fluid.   She denies vaginal bleeding.   She reports fetal movement.    We discussed lab results and recommendation to be admitted for induction based on BP and labs. We discussed risks/benefits and methods of induction. Patient's questions answered. She is currently accompanied by her mother and she will switch out with patient's boyfriend as support during labor.   Total weight gain for pregnancy: 19.5 kg   Obstetrical Problem List: pregnancy2 Problems (from 11/23/18 to present)    Problem Noted Resolved   Low grade squamous intraepithelial lesion (LGSIL) on cervical Pap smear 05/24/2019 by Rod Can, CNM No   Supervision of other normal pregnancy, antepartum 02/08/2019 by Rexene Agent, CNM No   Overview Addendum 05/24/2019 10:45 AM by Rod Can, [place] Prenatal Labs  Dating Ultrasound 7w 6d Blood type: O/Positive/-- (06/11 1407)   Genetic Screen 1 Screen:    AFP:     Quad:     NIPS: Antibody:Negative (06/11 1407)  Anatomic [place] Complete, growth less than 5% Rubella: 15.80 (06/11 1407) Varicella: Immune  GTT Third trimester:  RPR: Non Reactive (06/11 1407)   Rhogam  not needed HBsAg: Negative (06/11 1407)   Vaccines TDAP: 05/24/19                      Flu Shot:Declines HIV: Non Reactive (06/11 1407)   Baby Food                                GBS:   Contraception  Pap: LSIL 01/12/2019  CBB     CS/VBAC    Support Person                  Maternal Medical History:   Past Medical  History:  Diagnosis Date  . ADHD (attention deficit hyperactivity disorder)   . Anemia   . Chlamydia contact, treated 11/2016    Past Surgical History:  Procedure Laterality Date  . NONE      No Known Allergies  Prior to Admission medications   Medication Sig Start Date End Date Taking? Authorizing Provider  Prenatal Vit-Fe Fumarate-FA (MULTIVITAMIN-PRENATAL) 27-0.8 MG TABS tablet Take 1 tablet by mouth daily at 12 noon.   Yes [provider]  hydrocortisone cream 1 % Apply 1 application topically 4 (four) times daily. For hemorrhoidal discomfort 07/10/19   Dalia Heading, CNM    OB History  Gravida Para Term Preterm AB Living  2 0 0 0 1 0  SAB TAB Ectopic Multiple Live Births  1 0 0 0      # Outcome Date GA Lbr Len/2nd Weight Sex Delivery Anes PTL Lv  2 Current           1 SAB 2020            Prenatal care site: [facility]  Social History: She  reports that she  has never smoked. She has never used smokeless tobacco. She reports previous alcohol use. She reports previous drug use. Drug: Marijuana.  Family History: family history includes Asthma in her maternal aunt; Diabetes in her mother; Hypertension in her maternal grandmother and mother.   Review of Systems:  Review of Systems  Constitutional: Positive for malaise/fatigue.  HENT: Negative.   Eyes:       Visual spots  Respiratory: Negative.   Cardiovascular: Positive for leg swelling.  Gastrointestinal: Negative.   Genitourinary: Negative.   Musculoskeletal: Negative.   Skin: Negative.   Neurological: Negative.   Endo/Heme/Allergies: Negative.   Psychiatric/Behavioral: Negative.      Physical Exam:  BP (!) 145/88   Pulse 90   Temp 98.4 F (36.9 C)   Resp 16   LMP 11/23/2018   Constitutional: Well nourished, well developed female in no acute distress.  HEENT: normal Skin: Warm and dry.  Cardiovascular: Regular rate and rhythm.   Extremity: trace edema  Respiratory: Clear to  auscultation bilateral. Normal respiratory effort Abdomen: FHT present Back: no CVAT Neuro: DTRs 2+, Cranial nerves grossly intact Psych: Alert and Oriented x3. No memory deficits. Normal mood and affect.  MS: normal gait, normal bilateral lower extremity ROM/strength/stability.  Patient Vitals for the past 24 hrs:  BP Temp Pulse Resp  07/10/19 2253 (!) 145/88 -- 90 --  07/10/19 2240 (!) 140/91 -- 89 --  07/10/19 2223 140/86 -- 86 --  07/10/19 2208 (!) 141/83 -- 86 --  07/10/19 2154 (!) 141/89 98.4 F (36.9 C) 94 16    Pelvic exam: (female chaperone present) is limited by body habitus EGBUS: within normal limits Vagina: within normal limits and with normal mucosa Cervix: posterior, thick, -3  Results for Brandi, Watkins (MRN [id number]) as of 07/10/2019 23:53  Ref. Range 07/10/2019 22:22 07/10/2019 22:48  Sodium Latest Ref Range: 135 - 145 mmol/L  136  Potassium Latest Ref Range: 3.5 - 5.1 mmol/L  3.9  Chloride Latest Ref Range: 98 - 111 mmol/L  109  CO2 Latest Ref Range: 22 - 32 mmol/L  19 (L)  Glucose Latest Ref Range: 70 - 99 mg/dL  105 (H)  BUN Latest Ref Range: 6 - 20 mg/dL  8  Creatinine Latest Ref Range: 0.44 - 1.00 mg/dL  0.48  Calcium Latest Ref Range: 8.9 - 10.3 mg/dL  8.8 (L)  Anion gap Latest Ref Range: 5 - 15   8  Alkaline Phosphatase Latest Ref Range: 38 - 126 U/L  291 (H)  Albumin Latest Ref Range: 3.5 - 5.0 g/dL  3.1 (L)  AST Latest Ref Range: 15 - 41 U/L  20  ALT Latest Ref Range: 0 - 44 U/L  12  Total Protein Latest Ref Range: 6.5 - 8.1 g/dL  6.0 (L)  Total Bilirubin Latest Ref Range: 0.3 - 1.2 mg/dL  0.4  GFR, Est Non African American Latest Ref Range: >60 mL/min  >60  GFR, Est African American Latest Ref Range: >60 mL/min  >60  WBC Latest Ref Range: 4.0 - 10.5 K/uL  11.3 (H)  RBC Latest Ref Range: 3.87 - 5.11 MIL/uL  3.67 (L)  Hemoglobin Latest Ref Range: 12.0 - 15.0 g/dL  11.6 (L)  HCT Latest Ref Range: 36.0 - 46.0 %  35.2 (L)  MCV Latest Ref Range:  80.0 - 100.0 fL  95.9  MCH Latest Ref Range: 26.0 - 34.0 pg  31.6  MCHC Latest Ref Range: 30.0 - 36.0 g/dL  33.0  RDW Latest  Ref Range: 11.5 - 15.5 %  13.9  Platelets Latest Ref Range: 150 - 400 K/uL  114 (L)  nRBC Latest Ref Range: 0.0 - 0.2 %  0.0  Total Protein, Urine Latest Units: mg/dL 30   Protein Creatinine Ratio Latest Ref Range: 0.00 - 0.15 mg/mgCre 0.25 (H)   Creatinine, Urine Latest Units: mg/dL 118     Pertinent Results:  Prenatal Labs Blood type/Rh O positive  Antibody screen negative  Rubella Immune  Varicella Immune    RPR Non-reactive  HBsAg negative  HIV negative  GC negative  Chlamydia negative  Genetic screening Not done  1 hour GTT 74  3 hour GTT NA  GBS positive on 12/21   Baseline FHR: 130 beats/min   Variability: moderate   Accelerations: present   Decelerations: absent Contractions: occasional  Overall assessment: reassuring  Lab Results  Component Value Date   [place] NEGATIVE 07/10/2019  ]  Assessment:  Brandi Watkins is a 23 y.o. G22P0010 female at [redacted]w[redacted]d with induction of labor for new onset preeclampsia with low platelets.   Plan:  1. Admit to Labor & Delivery  2. Rapid covid swab 3. CBC, T&S, Clrs, IVF 4. GBS positive: penicillin prophylaxis.   5. Fetal well-being: Category I 6. Begin induction with cytotec 7. IV Labetalol PRN for severe range BP 8. Magnesium Sulfate IV if pressures become severe  Rod Can, CNM 07/10/2019 11:37 PM

## 2019-07-10 NOTE — Progress Notes (Signed)
C/o called after hour nurse yesterday - pressure on anal area causing hemorrhoids or skin tags - no irritation/pain, just pushed out.  What to take?  Back pain also. rj

## 2019-07-10 NOTE — OB Triage Note (Signed)
Recvd pt from ED. Pt had an appointment today and said she had two mildly elevated blood pressures. CNM went over preeclampsia symptoms today in office and pt states she has started to notice some swelling in her feet and feels tired. No complaints of contraction, no LOF and no vaginal bleeding. Pt states she has an appointment in two days for another blood pressure check.

## 2019-07-11 LAB — TYPE AND SCREEN
ABO/RH(D): O POS
Antibody Screen: NEGATIVE

## 2019-07-11 LAB — RESPIRATORY PANEL BY RT PCR (FLU A&B, COVID)
Influenza A by PCR: NEGATIVE
Influenza B by PCR: NEGATIVE
SARS Coronavirus 2 by RT PCR: NEGATIVE

## 2019-07-11 LAB — RPR: RPR Ser Ql: NONREACTIVE

## 2019-07-11 LAB — ABO/RH: ABO/RH(D): O POS

## 2019-07-11 MED ORDER — OXYTOCIN 10 UNIT/ML IJ SOLN
INTRAMUSCULAR | Status: AC
  Filled 2019-07-11: qty 2

## 2019-07-11 MED ORDER — LIDOCAINE HCL (PF) 1 % IJ SOLN
INTRAMUSCULAR | Status: AC
  Filled 2019-07-11: qty 30

## 2019-07-11 MED ORDER — MISOPROSTOL 50MCG HALF TABLET
ORAL_TABLET | ORAL | Status: AC
  Filled 2019-07-11: qty 1

## 2019-07-11 MED ORDER — MISOPROSTOL 200 MCG PO TABS
ORAL_TABLET | ORAL | Status: AC
  Administered 2019-07-11: 25 ug via VAGINAL
  Filled 2019-07-11: qty 4

## 2019-07-11 MED ORDER — AMMONIA AROMATIC IN INHA
RESPIRATORY_TRACT | Status: AC
  Filled 2019-07-11: qty 10

## 2019-07-11 MED ORDER — MISOPROSTOL 50MCG HALF TABLET
50.0000 ug | ORAL_TABLET | ORAL | Status: DC
  Administered 2019-07-11 – 2019-07-12 (×3): 50 ug via VAGINAL
  Filled 2019-07-11 (×2): qty 1

## 2019-07-11 MED ORDER — MISOPROSTOL 25 MCG QUARTER TABLET
25.0000 ug | ORAL_TABLET | ORAL | Status: DC
  Administered 2019-07-11: 25 ug via BUCCAL
  Filled 2019-07-11: qty 1

## 2019-07-11 NOTE — Progress Notes (Signed)
  Labor Progress Note   23 y.o. G2P0010 @ [redacted]w[redacted]d , admitted for  Pregnancy, Labor Management.   Subjective:  No pain She just had a meal. Will place 4th dose Cytotec now.  Objective:  BP (!) 122/48   Pulse 78   Temp 98.1 F (36.7 C) (Oral)   Resp 16   Ht 5\' 2"  (1.575 m)   Wt 90.1 kg   LMP 11/23/2018   BMI 36.32 kg/m  Abd: gravid, ND, FHT present, mild tenderness on exam Extr: trace to 1+ bilateral pedal edema SVE: CERVIX: FT cm dilated  EFM: FHR: 140 bpm, variability: moderate,  accelerations:  Present,  decelerations:  Absent Toco: None Labs: I have reviewed the patient's lab results.   Assessment & Plan:  G2P0010 @ [redacted]w[redacted]d, admitted for  Pregnancy and Labor/Delivery Management  1. Pain management: IV sedation. 2. FWB: FHT category 1.  3. ID: GBS positive 4. Labor management: Cytotec 50 mcg placed vaginally now  All discussed with patient, see orders  Barnett Applebaum, MD, [facility], [facility] 07/11/2019  4:45 PM

## 2019-07-11 NOTE — Progress Notes (Signed)
Pt up to shower

## 2019-07-12 ENCOUNTER — Encounter
Admission: EM | Disposition: A | Discharge status: Home / Self Care | Payer: Self-pay | Source: Home / Self Care | Attending: Certified Nurse Midwife

## 2019-07-12 ENCOUNTER — Encounter: Payer: Medicaid Other | Admitting: Advanced Practice Midwife

## 2019-07-12 ENCOUNTER — Inpatient Hospital Stay: Payer: Medicaid Other | Admitting: Anesthesiology

## 2019-07-12 ENCOUNTER — Encounter: Payer: Self-pay | Admitting: Obstetrics & Gynecology

## 2019-07-12 DIAGNOSIS — Z98891 History of uterine scar from previous surgery: Secondary | ICD-10-CM

## 2019-07-12 LAB — CBC WITH DIFFERENTIAL/PLATELET
Abs Immature Granulocytes: 0.09 10*3/uL — ABNORMAL HIGH (ref 0.00–0.07)
Basophils Absolute: 0 10*3/uL (ref 0.0–0.1)
Basophils Relative: 0 %
Eosinophils Absolute: 0.1 10*3/uL (ref 0.0–0.5)
Eosinophils Relative: 1 %
HCT: 37 % (ref 36.0–46.0)
Hemoglobin: 12.3 g/dL (ref 12.0–15.0)
Immature Granulocytes: 1 %
Lymphocytes Relative: 20 %
Lymphs Abs: 2.1 10*3/uL (ref 0.7–4.0)
MCH: 31.6 pg (ref 26.0–34.0)
MCHC: 33.2 g/dL (ref 30.0–36.0)
MCV: 95.1 fL (ref 80.0–100.0)
Monocytes Absolute: 0.7 10*3/uL (ref 0.1–1.0)
Monocytes Relative: 7 %
Neutro Abs: 7.1 10*3/uL (ref 1.7–7.7)
Neutrophils Relative %: 71 %
Platelets: 110 10*3/uL — ABNORMAL LOW (ref 150–400)
RBC: 3.89 MIL/uL (ref 3.87–5.11)
RDW: 13.7 % (ref 11.5–15.5)
Smear Review: NORMAL
WBC: 10.1 10*3/uL (ref 4.0–10.5)
nRBC: 0 % (ref 0.0–0.2)

## 2019-07-12 SURGERY — Surgical Case
Anesthesia: Choice

## 2019-07-12 MED ORDER — IBUPROFEN 600 MG PO TABS
600.0000 mg | ORAL_TABLET | Freq: Four times a day (QID) | ORAL | Status: DC
  Administered 2019-07-13 – 2019-07-14 (×4): 600 mg via ORAL
  Filled 2019-07-12 (×4): qty 1

## 2019-07-12 MED ORDER — CEFAZOLIN SODIUM-DEXTROSE 2-4 GM/100ML-% IV SOLN
2.0000 g | INTRAVENOUS | Status: AC
  Administered 2019-07-12: 2 g via INTRAVENOUS
  Filled 2019-07-12: qty 100

## 2019-07-12 MED ORDER — DIPHENHYDRAMINE HCL 50 MG/ML IJ SOLN: 12.5000 mg | INTRAMUSCULAR | Status: DC | PRN

## 2019-07-12 MED ORDER — SENNOSIDES-DOCUSATE SODIUM 8.6-50 MG PO TABS
2.0000 | ORAL_TABLET | ORAL | Status: DC
  Administered 2019-07-13 (×2): 2 via ORAL
  Filled 2019-07-12 (×2): qty 2

## 2019-07-12 MED ORDER — LACTATED RINGERS IV SOLN: INTRAVENOUS | Status: DC

## 2019-07-12 MED ORDER — PHENYLEPHRINE HCL-NACL 20-0.9 MG/250ML-% IV SOLN
INTRAVENOUS | Status: DC | PRN
  Administered 2019-07-12: 30 ug/min via INTRAVENOUS

## 2019-07-12 MED ORDER — SODIUM CHLORIDE 0.9 % IV SOLN
500.0000 mg | Freq: Once | INTRAVENOUS | Status: AC
  Administered 2019-07-12: 500 mg via INTRAVENOUS
  Filled 2019-07-12: qty 500

## 2019-07-12 MED ORDER — DIBUCAINE (PERIANAL) 1 % EX OINT: 1.0000 "application " | TOPICAL_OINTMENT | CUTANEOUS | Status: DC | PRN

## 2019-07-12 MED ORDER — MORPHINE SULFATE (PF) 0.5 MG/ML IJ SOLN
INTRAMUSCULAR | Status: AC
  Filled 2019-07-12: qty 10

## 2019-07-12 MED ORDER — MENTHOL 3 MG MT LOZG
1.0000 | LOZENGE | OROMUCOSAL | Status: DC | PRN
  Filled 2019-07-12: qty 9

## 2019-07-12 MED ORDER — SIMETHICONE 80 MG PO CHEW
80.0000 mg | CHEWABLE_TABLET | Freq: Three times a day (TID) | ORAL | Status: DC
  Administered 2019-07-12 – 2019-07-14 (×6): 80 mg via ORAL
  Filled 2019-07-12 (×5): qty 1

## 2019-07-12 MED ORDER — NALBUPHINE HCL 10 MG/ML IJ SOLN: 5.0000 mg | INTRAMUSCULAR | Status: DC | PRN

## 2019-07-12 MED ORDER — SOD CITRATE-CITRIC ACID 500-334 MG/5ML PO SOLN: 30.0000 mL | ORAL | Status: AC

## 2019-07-12 MED ORDER — FENTANYL CITRATE (PF) 100 MCG/2ML IJ SOLN
INTRAMUSCULAR | Status: DC | PRN
  Administered 2019-07-12: 15 ug via INTRAVENOUS

## 2019-07-12 MED ORDER — COCONUT OIL OIL
1.0000 "application " | TOPICAL_OIL | Status: DC | PRN
  Filled 2019-07-12: qty 120

## 2019-07-12 MED ORDER — OXYTOCIN 40 UNITS IN NORMAL SALINE INFUSION - SIMPLE MED
2.5000 [IU]/h | INTRAVENOUS | Status: AC
  Administered 2019-07-12: 2.5 [IU]/h via INTRAVENOUS
  Filled 2019-07-12: qty 1000

## 2019-07-12 MED ORDER — NALBUPHINE HCL 10 MG/ML IJ SOLN: 5.0000 mg | Freq: Once | INTRAMUSCULAR | Status: DC | PRN

## 2019-07-12 MED ORDER — PRENATAL MULTIVITAMIN CH
1.0000 | ORAL_TABLET | Freq: Every day | ORAL | Status: DC
  Administered 2019-07-12 – 2019-07-14 (×3): 1 via ORAL
  Filled 2019-07-12 (×3): qty 1

## 2019-07-12 MED ORDER — BUPIVACAINE 0.25 % ON-Q PUMP DUAL CATH 400 ML
400.0000 mL | INJECTION | Status: DC
  Filled 2019-07-12: qty 400

## 2019-07-12 MED ORDER — KETOROLAC TROMETHAMINE 30 MG/ML IJ SOLN: 30.0000 mg | Freq: Four times a day (QID) | INTRAMUSCULAR | Status: AC | PRN

## 2019-07-12 MED ORDER — ONDANSETRON HCL 4 MG/2ML IJ SOLN: 4.0000 mg | Freq: Once | INTRAMUSCULAR | Status: DC | PRN

## 2019-07-12 MED ORDER — DIPHENHYDRAMINE HCL 25 MG PO CAPS: 25.0000 mg | ORAL_CAPSULE | ORAL | Status: DC | PRN

## 2019-07-12 MED ORDER — ONDANSETRON HCL 4 MG/2ML IJ SOLN: 4.0000 mg | Freq: Three times a day (TID) | INTRAMUSCULAR | Status: DC | PRN

## 2019-07-12 MED ORDER — SODIUM CHLORIDE 0.9% FLUSH: 3.0000 mL | INTRAVENOUS | Status: DC | PRN

## 2019-07-12 MED ORDER — FENTANYL CITRATE (PF) 100 MCG/2ML IJ SOLN
INTRAMUSCULAR | Status: AC
  Filled 2019-07-12: qty 2

## 2019-07-12 MED ORDER — OXYTOCIN 10 UNIT/ML IJ SOLN
INTRAMUSCULAR | Status: AC
  Filled 2019-07-12: qty 2

## 2019-07-12 MED ORDER — MEPERIDINE HCL 50 MG/ML IJ SOLN: 6.2500 mg | INTRAMUSCULAR | Status: DC | PRN

## 2019-07-12 MED ORDER — ONDANSETRON HCL 4 MG/2ML IJ SOLN
INTRAMUSCULAR | Status: AC
  Filled 2019-07-12: qty 2

## 2019-07-12 MED ORDER — NALOXONE HCL 0.4 MG/ML IJ SOLN
0.4000 mg | INTRAMUSCULAR | Status: DC | PRN
  Filled 2019-07-12: qty 1

## 2019-07-12 MED ORDER — MORPHINE SULFATE (PF) 0.5 MG/ML IJ SOLN
INTRAMUSCULAR | Status: DC | PRN
  Administered 2019-07-12: .2 mg via EPIDURAL

## 2019-07-12 MED ORDER — KETOROLAC TROMETHAMINE 30 MG/ML IJ SOLN
30.0000 mg | Freq: Four times a day (QID) | INTRAMUSCULAR | Status: AC | PRN
  Administered 2019-07-12 – 2019-07-13 (×2): 30 mg via INTRAVENOUS
  Filled 2019-07-12 (×2): qty 1

## 2019-07-12 MED ORDER — OXYCODONE-ACETAMINOPHEN 5-325 MG PO TABS
1.0000 | ORAL_TABLET | ORAL | Status: DC | PRN
  Administered 2019-07-13: 1 via ORAL
  Filled 2019-07-12: qty 1

## 2019-07-12 MED ORDER — SOD CITRATE-CITRIC ACID 500-334 MG/5ML PO SOLN
ORAL | Status: AC
  Administered 2019-07-12: 30 mL via ORAL
  Filled 2019-07-12: qty 30

## 2019-07-12 MED ORDER — ACETAMINOPHEN 500 MG PO TABS
1000.0000 mg | ORAL_TABLET | Freq: Four times a day (QID) | ORAL | Status: AC
  Administered 2019-07-12 – 2019-07-13 (×4): 1000 mg via ORAL
  Filled 2019-07-12 (×4): qty 2

## 2019-07-12 MED ORDER — OXYCODONE-ACETAMINOPHEN 5-325 MG PO TABS
2.0000 | ORAL_TABLET | ORAL | Status: DC | PRN
  Administered 2019-07-14: 12:00:00 2 via ORAL
  Filled 2019-07-12: qty 2

## 2019-07-12 MED ORDER — DIPHENHYDRAMINE HCL 25 MG PO CAPS: 25.0000 mg | ORAL_CAPSULE | Freq: Four times a day (QID) | ORAL | Status: DC | PRN

## 2019-07-12 MED ORDER — FENTANYL CITRATE (PF) 100 MCG/2ML IJ SOLN: 25.0000 ug | INTRAMUSCULAR | Status: DC | PRN

## 2019-07-12 MED ORDER — FERROUS SULFATE 325 (65 FE) MG PO TABS
325.0000 mg | ORAL_TABLET | Freq: Two times a day (BID) | ORAL | Status: DC
  Administered 2019-07-12 – 2019-07-14 (×4): 325 mg via ORAL
  Filled 2019-07-12 (×3): qty 1

## 2019-07-12 MED ORDER — TERBUTALINE SULFATE 1 MG/ML IJ SOLN
0.2500 mg | Freq: Once | INTRAMUSCULAR | Status: AC
  Administered 2019-07-12: 0.25 mg via SUBCUTANEOUS

## 2019-07-12 MED ORDER — NALOXONE HCL 4 MG/10ML IJ SOLN
1.0000 ug/kg/h | INTRAVENOUS | Status: DC | PRN
  Filled 2019-07-12: qty 5

## 2019-07-12 MED ORDER — BUPIVACAINE HCL (PF) 0.5 % IJ SOLN
10.0000 mL | Freq: Once | INTRAMUSCULAR | Status: DC
  Administered 2019-07-12: 10 mL
  Filled 2019-07-12: qty 30

## 2019-07-12 MED ORDER — WITCH HAZEL-GLYCERIN EX PADS: 1.0000 "application " | MEDICATED_PAD | CUTANEOUS | Status: DC | PRN

## 2019-07-12 SURGICAL SUPPLY — 31 items
CANISTER SUCT 3000ML PPV (MISCELLANEOUS) ×3 IMPLANT
CATH KIT ON-Q SILVERSOAK 5IN (CATHETERS) ×6 IMPLANT
CLOSURE WOUND 1/2 X4 (GAUZE/BANDAGES/DRESSINGS) ×1
COVER WAND RF STERILE (DRAPES) ×3 IMPLANT
DERMABOND ADVANCED (GAUZE/BANDAGES/DRESSINGS) ×2
DERMABOND ADVANCED .7 DNX12 (GAUZE/BANDAGES/DRESSINGS) ×1 IMPLANT
DRSG OPSITE POSTOP 4X10 (GAUZE/BANDAGES/DRESSINGS) ×3 IMPLANT
DRSG TELFA 3X8 NADH (GAUZE/BANDAGES/DRESSINGS) ×3 IMPLANT
ELECT CAUTERY BLADE 6.4 (BLADE) ×3 IMPLANT
ELECT REM PT RETURN 9FT ADLT (ELECTROSURGICAL) ×3
ELECTRODE REM PT RTRN 9FT ADLT (ELECTROSURGICAL) ×1 IMPLANT
GAUZE SPONGE 4X4 12PLY STRL (GAUZE/BANDAGES/DRESSINGS) ×6 IMPLANT
GLOVE BIO SURGEON STRL SZ7 (GLOVE) ×3 IMPLANT
GLOVE INDICATOR 7.5 STRL GRN (GLOVE) ×3 IMPLANT
GOWN STRL REUS W/ TWL LRG LVL3 (GOWN DISPOSABLE) ×3 IMPLANT
GOWN STRL REUS W/TWL LRG LVL3 (GOWN DISPOSABLE) ×6
NS IRRIG 1000ML POUR BTL (IV SOLUTION) ×3 IMPLANT
PACK C SECTION AR (MISCELLANEOUS) ×3 IMPLANT
PAD OB MATERNITY 4.3X12.25 (PERSONAL CARE ITEMS) ×6 IMPLANT
PAD PREP 24X41 OB/GYN DISP (PERSONAL CARE ITEMS) ×3 IMPLANT
PENCIL SMOKE ULTRAEVAC 22 CON (MISCELLANEOUS) ×3 IMPLANT
STRIP CLOSURE SKIN 1/2X4 (GAUZE/BANDAGES/DRESSINGS) ×2 IMPLANT
SUT MNCRL 4-0 (SUTURE) ×2
SUT MNCRL 4-0 27XMFL (SUTURE) ×1
SUT PDS AB 1 TP1 96 (SUTURE) ×3 IMPLANT
SUT PLAIN GUT 0 (SUTURE) IMPLANT
SUT VIC AB 0 CTX 36 (SUTURE) ×4
SUT VIC AB 0 CTX36XBRD ANBCTRL (SUTURE) ×2 IMPLANT
SUT VICRYL 3-0 SH-1 18IN (SUTURE) ×3 IMPLANT
SUTURE MNCRL 4-0 27XMF (SUTURE) ×1 IMPLANT
SWABSTK COMLB BENZOIN TINCTURE (MISCELLANEOUS) ×3 IMPLANT

## 2019-07-12 NOTE — Progress Notes (Addendum)
L&D Progress Note    23 year old G2 P0010 now 36 hours post admission for IOL for gestational hypertension. Her blood pressures have been in the normal to mild range. Has received 6 doses of Cytotec, the last dose was 50 mcg at 0244 this AM. She had hyperstimulation after that with bouts of repetitive late decelerations. She received 2 doses of terbutaline for the hyperstimulation. Having mild contractions every 2-3 minutes now that she was sleeping through. Baseline FHR is 155 and there are repetitive late decelerations despite position changes. Cervix is closed/?50%/OOP after 36 hours of induction and Cephalic on Leopold's Repeat platelet count this AM is 110K   A: Fetal intolerance of labor remote from delivery  P: Consulted Dr Glennon Mac regarding Cesarean section for delivery Discussed with patient risks of Cesarean section including bleeding, infection, injury to other organs in the abdomen like bowel, bladder or other blood vessels and the risk of anesthesia. Also consented for blood transfusion in case of excessive bleeding.  Dr Glennon Mac on way to hospital.    Dalia Heading, CNM

## 2019-07-12 NOTE — Progress Notes (Signed)
Pt anxious about second terbutaline shot and wants to refuse intervention. Explained importance and FHR and uterine activity. Pt called her mom and I explained rationale to her. Pt agreed and verbalizes understanding.

## 2019-07-12 NOTE — Lactation Note (Signed)
This note was copied from a baby's chart. Lactation Consultation Note  Patient Name: Brandi Watkins Today's Date: 07/12/2019 Reason for consult: Initial assessment;1st time breastfeeding;Early term 37-38.6wks  LC called in to assist mom with first breastfeeding attempt post c-section delivery. This is mom's first baby, Brandi Watkins, and first time breastfeeding. LC guided [place] student in first latching efforts, baby was cuing with hands to mouth, and open/closing mouth next to mom's chest. LC student placed baby in cross-cradle position, educating mom on how and where to position hands/arms. [place] student sandwiched breast tissue, and encouraged a wide open mouth from baby by place nose to nipple, breast compression/massage, and slight hand expression of colostrum. Baby was heard to have some mucous in mouth, and seemed uninterested at the breast. Baby brought up and burped multiple times, and placed back at breast with [place] on opposite breast with latching attempt; again Brandi Watkins was uninterested but did manage a few sucks, before appearing to sleep. Baby left skin to skin upright on mom's chest. Mom educated on early breastfeeding with baby, newborn stomach size, feeding frequency, and early cues.  LC and [place] student will follow-up for ongoing breastfeeding efforts.  Maternal Data Formula Feeding for Exclusion: No Has patient been taught Hand Expression?: Yes Does the patient have breastfeeding experience prior to this delivery?: No  Feeding Feeding Type: Breast Fed  LATCH Score Latch: Too sleepy or reluctant, no latch achieved, no sucking elicited.  Audible Swallowing: None  Type of Nipple: Everted at rest and after stimulation  Comfort (Breast/Nipple): Soft / non-tender  Hold (Positioning): Assistance needed to correctly position infant at breast and maintain latch.  LATCH Score: 5  Interventions Interventions: Breast feeding basics reviewed;Assisted with latch;Breast massage;Hand  express;Adjust position;Support pillows  Lactation Tools Discussed/Used     Consult Status Consult Status: Follow-up Date: 07/12/19 Follow-up type: In-patient    [name] 07/12/2019, 2:01 PM

## 2019-07-12 NOTE — Lactation Note (Signed)
This note was copied from a baby's chart. Lactation Consultation Note  Patient Name: Brandi Watkins [id number] Date: 07/12/2019 Reason for consult: Follow-up assessment;1st time breastfeeding;Early term 37-38.6wks  LC and [place] student in to assist mom with second breastfeeding attempt. Baby was in bassinet, content. Discussed with mom importance of frequent attempts, aiming for 8-12 in the first 24 hours. Discussed newborn stomach size, and early hunger cues. [place] student brought baby to mom's breasts in cross-cradle hold with mom taking over positioning of baby, multiple attempts made to latch baby, baby content and uninterested, sucking on his own tongue. [place] student inserted finger into mouth to help establish a sucking pattern. Baby is able to rhythmically suck on finger with strong suction and wave like tongue motion; attempt to transition baby from finger to breast unsuccessful.  Baby did appear to be spitty and gaggy, [place] student turning baby upright and patting back. Parents educated on signs of when to help baby with mucous. LC and [place] student discussed with mom option of initiating pumping to help with breast stimulation and colostrum/milk removal in efforts to feed baby, will introduce pump after next feeding attempt. Mom to rest for now.   Maternal Data Formula Feeding for Exclusion: No Has patient been taught Hand Expression?: Yes Does the patient have breastfeeding experience prior to this delivery?: No  Feeding Feeding Type: Breast Fed  LATCH Score Latch: Too sleepy or reluctant, no latch achieved, no sucking elicited.  Audible Swallowing: None  Type of Nipple: Everted at rest and after stimulation  Comfort (Breast/Nipple): Soft / non-tender  Hold (Positioning): Assistance needed to correctly position infant at breast and maintain latch.  LATCH Score: 5  Interventions Interventions: Breast feeding basics reviewed;Assisted with latch;Hand express;Breast compression;Support  pillows  Lactation Tools Discussed/Used     Consult Status Consult Status: Follow-up Date: 07/12/19 Follow-up type: In-patient    [name] 07/12/2019, 2:35 PM

## 2019-07-12 NOTE — Anesthesia Preprocedure Evaluation (Signed)
Anesthesia Evaluation  Patient identified by MRN, date of birth, ID band Patient awake    Reviewed: Allergy & Precautions, NPO status , Patient's Chart, lab work & pertinent test results, reviewed documented beta blocker date and time   Airway Mallampati: III  TM Distance: >3 FB     Dental  (+) Chipped   Pulmonary           Cardiovascular      Neuro/Psych    GI/Hepatic   Endo/Other    Renal/GU      Musculoskeletal   Abdominal   Peds  Hematology  (+) anemia ,   Anesthesia Other Findings Obese. ADHD.  Reproductive/Obstetrics                             Anesthesia Physical Anesthesia Plan  ASA: III  Anesthesia Plan:    Post-op Pain Management:    Induction: Intravenous  PONV Risk Score and Plan:   Airway Management Planned:   Additional Equipment:   Intra-op Plan:   Post-operative Plan:   Informed Consent: I have reviewed the patients History and Physical, chart, labs and discussed the procedure including the risks, benefits and alternatives for the proposed anesthesia with the patient or authorized representative who has indicated his/her understanding and acceptance.       Plan Discussed with: CRNA  Anesthesia Plan Comments:         Anesthesia Quick Evaluation

## 2019-07-12 NOTE — Op Note (Signed)
Cesarean Section Operative Note    Brandi Watkins   07/12/2019   Pre-operative Diagnosis:  1) Fetal intolerance of labor 2) intrauterine pregnancy at [redacted]w[redacted]d  3) Gestational hypertension, third trimester  Post-operative Diagnosis:  1) Fetal intolerance of labor 2) intrauterine pregnancy at [redacted]w[redacted]d  3) Gestational hypertension, third trimester   Procedure: Primary Low Transverse Cesarean Section via Pfannenstiel incision with double layer uterine closure  Surgeon: Surgeon(s) and Role:    * Will Bonnet, MD - Primary   Assistants:  Dalia Heading, CNM; No other capable assistant available, in surgery requiring high level assistant.  Anesthesia: spinal   Findings:  1) normal appearing gravid uterus, fallopian tubes, and ovaries 2) viable female infant with APGARs 6 and 8, weight 2,560 grams   Quantified Blood Loss: 403 mL  Total IV Fluids: 1,700 ml crystalloid  Urine Output: present, clear  Specimens: none  Complications: no complications  Disposition: PACU - hemodynamically stable.   Maternal Condition: stable   Baby condition / location:  Couplet care / Skin to Skin  Procedure Details:  The patient was seen in the Holding Room. The risks, benefits, complications, treatment options, and expected outcomes were discussed with the patient. The patient concurred with the proposed plan, giving informed consent. identified as Brandi Watkins and the procedure verified as C-Section Delivery. A Time Out was held and the above information confirmed.   After induction of anesthesia, the patient was draped and prepped in the usual sterile manner. A Pfannenstiel incision was made and carried down through the subcutaneous tissue to the fascia. Fascial incision was made and extended transversely. The fascia was separated from the underlying rectus tissue superiorly and inferiorly. The peritoneum was identified and entered. Peritoneal incision was extended longitudinally. The bladder  flap was not bluntly or sharply freed from the lower uterine segment. A low transverse uterine incision was made and the hysterotomy was extended with cranial-caudal tension. Delivered from cephalic presentation was a 2,560 gram Living newborn infant(s) or Female with Apgar scores of 6 at one minute and 8 at five minutes. Cord ph was not sent the umbilical cord was clamped and cut cord blood was obtained for evaluation. The placenta was removed Intact and appeared normal. The uterine outline, tubes and ovaries appeared normal. The uterine incision was closed with running locked sutures of 0 Vicryl.  A second layer of the same suture was thrown in an imbricating fashion.  Hemostasis was assured.  The uterus was returned to the abdomen and the paracolic gutters were cleared of all clots and debris.  The rectus muscles were inspected and found to be hemostatic.  The On-Q catheter pumps were inserted in accordance with the manufacturer's recommendations.  The catheters were inserted approximately 4cm cephelad to the incision line, approximately 1cm apart, straddling the midline.  They were inserted to a depth of the 4th mark. They were positioned superficial to the rectus abdominus muscles and deep to the rectus fascia.    The fascia was then reapproximated with running sutures of 1-0 PDS, looped. The subcutaneous tissue was reapproximated using 3-0 vicryl such that no greater than 2cm of dead space remained. The subcuticular closure was performed using 4-0 monocryl. The skin closure was reinforced using benzoin and 1/2" steri-strips.  The On-Q catheters were bolused with 5 mL of 0.5% marcaine plain for a total of 10 mL.  The catheters were affixed to the skin with surgical skin glue, steri-strips, and tegaderm.    The surgical assistant performed  tissue retraction, suction, cauterization, fundal pressure, assistance with suturing.  Instrument, sponge, and needle counts were correct prior the abdominal closure  and were correct at the conclusion of the case.  The patient received Ancef 2 gram IV and Azithromycin 500 mg IV prior to skin incision (within 30 minutes). For VTE prophylaxis she was wearing SCDs throughout the case.  The assistant surgeon was a CNM due to lack of availability of another Counselling psychologist.    Signed: Will Bonnet, MD 07/12/2019 11:20 AM

## 2019-07-12 NOTE — Lactation Note (Signed)
This note was copied from a baby's chart. Lactation Consultation Note  Patient Name: Brandi Watkins [id number] Date: 07/12/2019 Reason for consult: Follow-up assessment  LC student entered room to mom holding baby skin to skin. Baby was fast asleep and did not wake with stimulation so was placed into the bassinet. [place] student explained setup, parts, cleaning and use of DEBP. Mom was fitted for flange, and feels comfortable with the 27s. Colostrum was present in the left flange after pumping. Mom was encouraged to hand express into a spoon after pumping to provide colostrum for the baby and easily hand expressed 2 - 57ml into the spoon.   Attempted to latch baby to both breasts after changing a poop diaper (at which time output expectations were explained), and baby did not latch. Baby was fed the expressed colostrum with a finger and syringe. Parents were educated to keep feeding baby expressed colostrum with the syringe until baby wakes enough to latch. Parents were educated that baby's behavior will change, and that he will want to suck and eat when he wakes up a bit more and to rest and be ready for that time.   [place] number is on the board should parents need further assistance.    Maternal Data Formula Feeding for Exclusion: No Has patient been taught Hand Expression?: Yes Does the patient have breastfeeding experience prior to this delivery?: No  Feeding Feeding Type: Breast Fed  LATCH Score Latch: Too sleepy or reluctant, no latch achieved, no sucking elicited.  Audible Swallowing: None  Type of Nipple: Everted at rest and after stimulation  Comfort (Breast/Nipple): Soft / non-tender  Hold (Positioning): No assistance needed to correctly position infant at breast.  LATCH Score: 6  Interventions Interventions: Breast feeding basics reviewed;Skin to skin;Assisted with latch;Hand express;Coconut oil;Expressed milk;DEBP  Lactation Tools Discussed/Used Initiated by:: Marcell Anger Date  initiated:: 07/12/19   Consult Status Consult Status: Follow-up Date: 07/12/19 Follow-up type: In-patient    [name] 07/12/2019, 4:36 PM

## 2019-07-12 NOTE — Discharge Summary (Signed)
OB Discharge Summary     Patient Name: Brandi Watkins DOB: 01-30-1996 MRN: [id number]  Date of admission: 07/10/2019 Delivering MD: Prentice Docker, MD  Date of Delivery: 07/12/2019  Date of discharge: 07/14/2019  Admitting diagnosis: Indication for care in labor and delivery, antepartum [O75.9] Preeclampsia, third trimester [O14.93] Intrauterine pregnancy: [redacted]w[redacted]d     Secondary diagnosis: Preeclampsia     Discharge diagnosis: Term Pregnancy Delivered and Preeclampsia (mild)                                                                                                Post partum procedures:none  Augmentation: Cytotec  Complications: None  Hospital course:  Induction of Labor With Cesarean Section  23 y.o. yo G2P0010 at [redacted]w[redacted]d was admitted to the hospital 07/10/2019 for induction of labor. Patient had a labor course significant for multiple doses of misoprostol.  She did not make cervical change. However, she did start hyperstimulating with resultant fetal distress.  She was given two doses of terbutaline. However, she continued to have mild contractions with recurrent late decelerations after each contraction.  Given her status as remote from delivery and the recurrent late decelerations even after two doses of terbutaline, she was taken to the OR for abdominal delivery. The patient went for cesarean section due to fetal intolerance of labor, and delivered a Viable infant,07/12/2019  Membrane Rupture Time/Date: 10:34 AM ,07/12/2019   Details of operation can be found in separate operative Note.  Patient had an uncomplicated postpartum course. She is ambulating, tolerating a regular diet, passing flatus, and urinating well.  Patient is discharged home in stable condition on 07/14/19.                                    Physical exam  Vitals:   07/13/19 2015 07/13/19 2018 07/13/19 2344 07/14/19 0812  BP: (!) 137/92 (!) 135/91 (!) 130/91 136/86  Pulse: 85 83 90 96  Resp: 20  19 20   Temp:  98 F (36.7 C)  98.1 F (36.7 C) 98.1 F (36.7 C)  TempSrc: Oral  Oral Oral  SpO2: 100% 100% 99% 97%  Weight:      Height:       General: alert, cooperative and no distress Lochia: appropriate Uterine Fundus: firm Incision: Healing well with no significant drainage DVT Evaluation: No evidence of DVT seen on physical exam.  Labs: Lab Results  Component Value Date   WBC 10.3 07/13/2019   HGB 10.9 (L) 07/13/2019   HCT 31.5 (L) 07/13/2019   MCV 93.2 07/13/2019   PLT 116 (L) 07/13/2019    Discharge instruction: per After Visit Summary.  Medications:  Allergies as of 07/14/2019   No Known Allergies     Medication List    TAKE these medications   hydrocortisone cream 1 % Apply 1 application topically 4 (four) times daily. For hemorrhoidal discomfort   multivitamin-prenatal 27-0.8 MG Tabs tablet Take 1 tablet by mouth daily at 12 noon.   oxyCODONE-acetaminophen 5-325 MG tablet Commonly known as: PERCOCET/ROXICET Take  1 tablet by mouth every 4 (four) hours as needed for moderate pain (pain score 4-7/10).     COLACE for stool softener for 2 weeks  Diet: routine diet  Activity: Advance as tolerated. Pelvic rest for 6 weeks.   Outpatient follow up: Follow-up Information    Will Bonnet, MD. Schedule an appointment as soon as possible for a visit in 1 week(s).   Specialty: Obstetrics and Gynecology Why: Post-op incision check Contact information: [address] [place] [address] [phone]             Postpartum contraception: none desired at this time Rhogam Given postpartum: no Rubella vaccine given postpartum: no Varicella vaccine given postpartum: no TDaP given antepartum or postpartum: 05/24/2019  Newborn Data: Live born female  Birth Weight: 5 lb 10.3 oz (2560 g) APGAR: 6, 8  Newborn Delivery   Birth date/time: 07/12/2019 10:36:00 Delivery type: C-Section, Low Transverse C-section categorization: Primary       Baby Feeding:  Breast  Disposition:home with mother  SIGNED: Barnett Applebaum, MD, [facility], [facility] 07/14/2019  10:09 AM

## 2019-07-12 NOTE — Transfer of Care (Signed)
Immediate Anesthesia Transfer of Care Note  Patient: Brandi Watkins  Procedure(s) Performed: CESAREAN SECTION (N/A )  Patient Location: PACU  Anesthesia Type:Spinal  Level of Consciousness: awake, alert  and oriented  Airway & Oxygen Therapy: Patient Spontanous Breathing  Post-op Assessment: Report given to RN and Post -op Vital signs reviewed and stable  Post vital signs: Reviewed and stable  Last Vitals:  Vitals Value Taken Time  BP    Temp    Pulse    Resp    SpO2      Last Pain:  Vitals:   07/12/19 0739  TempSrc: Oral  PainSc: 4          Complications: No apparent anesthesia complications

## 2019-07-12 NOTE — Progress Notes (Signed)
Called by CNM due to recurrent late decelerations. She has had six doses of cytotec (last dose was 50 mcg).  She began having hyperstimulation of the uterus with some resultant late decelerations.  She has been given two doses of terbutaline.  She now continue to have mild contractions that she does feel.  She continues to have recurrent late decelerations. Her cervix was check and she is still closed. Being remote from delivery and with recurrent late decelerations, it is unlikely that she will have a safe and successful labor process.  This was discussed in detail with the patient along with my recommendation that we proceed with a c-section urgently due to recurrent late decelerations.  She voiced understanding and agreement. I personally consented the patient for the procedure and explained it in detail.  Fetal tracing: FHR basline: 155 bpm, recurrent late decelerations to 130s-140s.  Variability is moderate at this time.  No accelerations. TOCO: 3 q 10 min  Prentice Docker, MD, [employer] [facility] 07/12/2019 9:29 AM

## 2019-07-12 NOTE — Anesthesia Post-op Follow-up Note (Signed)
Anesthesia QCDR form completed.        

## 2019-07-12 NOTE — Progress Notes (Signed)
Pt with continual mild uterine tightening, pt is snoring and unaware she is having any contractions. Repeated repositioning and fluid bolus unable to improve FHR . Pts uterus palpated very mild to minimally taut every one minute. Call to Dr Kenton Kingfisher

## 2019-07-13 LAB — CBC
HCT: 31.5 % — ABNORMAL LOW (ref 36.0–46.0)
Hemoglobin: 10.9 g/dL — ABNORMAL LOW (ref 12.0–15.0)
MCH: 32.2 pg (ref 26.0–34.0)
MCHC: 34.6 g/dL (ref 30.0–36.0)
MCV: 93.2 fL (ref 80.0–100.0)
Platelets: 116 10*3/uL — ABNORMAL LOW (ref 150–400)
RBC: 3.38 MIL/uL — ABNORMAL LOW (ref 3.87–5.11)
RDW: 13.9 % (ref 11.5–15.5)
WBC: 10.3 10*3/uL (ref 4.0–10.5)
nRBC: 0 % (ref 0.0–0.2)

## 2019-07-13 NOTE — Anesthesia Postprocedure Evaluation (Signed)
Anesthesia Post Note  Patient: Timira V Noa  Procedure(s) Performed: CESAREAN SECTION (N/A )  Patient location during evaluation: Mother Baby Anesthesia Type: Spinal Level of consciousness: oriented and awake and alert Pain management: pain level controlled Vital Signs Assessment: post-procedure vital signs reviewed and stable Respiratory status: spontaneous breathing and respiratory function stable Cardiovascular status: blood pressure returned to baseline and stable Postop Assessment: no headache, no backache, no apparent nausea or vomiting and able to ambulate Anesthetic complications: no     Last Vitals:  Vitals:   07/13/19 0316 07/13/19 0500  BP: 120/77   Pulse: 97   Resp: 20   Temp: 36.6 C   SpO2: 94% 98%    Last Pain:  Vitals:   07/13/19 0316  TempSrc: Oral  PainSc:                  Brantley Fling

## 2019-07-13 NOTE — Lactation Note (Signed)
This note was copied from a baby's chart. Lactation Consultation Note  Patient Name: Brandi Watkins [id number] Date: 07/13/2019 Reason for consult: Follow-up assessment;Mother's request;Difficult latch;[place];Early term 37-38.6wks;Infant < 6lbs;Other (Comment)(Sleepy baby)  Baby is sleepy.  Attempted to latch and suck without success.  Mom reports baby has been latching and sucking great, but has just been sleepy for this feeding.    Maternal Data Formula Feeding for Exclusion: No Has patient been taught Hand Expression?: Yes Does the patient have breastfeeding experience prior to this delivery?: No  Feeding Feeding Type: Breast Fed  LATCH Score Latch: Too sleepy or reluctant, no latch achieved, no sucking elicited.  Audible Swallowing: None  Type of Nipple: Everted at rest and after stimulation  Comfort (Breast/Nipple): Soft / non-tender  Hold (Positioning): Full assist, staff holds infant at breast  LATCH Score: 4  Interventions Interventions: Breast feeding basics reviewed;Assisted with latch;Breast massage;Hand express;Reverse pressure;Breast compression;Adjust position;Support pillows;Position options;DEBP  Lactation Tools Discussed/Used WIC Program: Yes Pump Review: Setup, frequency, and cleaning;Milk Storage;Other (comment) Initiated by:: S.[place],MPH,IBCLC Date initiated:: 07/12/19   Consult Status Consult Status: PRN Follow-up type: Call as needed    Jarold Motto 07/13/2019, 11:27 AM

## 2019-07-13 NOTE — Progress Notes (Signed)
Obstetric Postpartum/PostOperative Daily Progress Note Subjective:  23 y.o. G2P1011 post-operative day # 1 status post primary cesarean delivery.  She is ambulating, is tolerating po, is voiding spontaneously.  Her pain is moderately well controlled on PO pain medications. Her lochia is less than menses.   Medications SCHEDULED MEDICATIONS  . ferrous sulfate  325 mg Oral BID WC  . ibuprofen  600 mg Oral Q6H  . prenatal multivitamin  1 tablet Oral Q1200  . senna-docusate  2 tablet Oral Q24H  . simethicone  80 mg Oral TID PC    MEDICATION INFUSIONS  . lactated ringers 125 mL/hr at 07/13/19 0457  . naLOXone (NARCAN) adult infusion for PRURITIS      PRN MEDICATIONS  coconut oil, witch hazel-glycerin **AND** dibucaine, diphenhydrAMINE, ketorolac **OR** ketorolac, menthol-cetylpyridinium, nalbuphine **OR** nalbuphine, naloxone **AND** sodium chloride flush, naLOXone (NARCAN) adult infusion for PRURITIS, ondansetron (ZOFRAN) IV, oxyCODONE-acetaminophen **OR** oxyCODONE-acetaminophen    Objective:   Vitals:   07/13/19 0300 07/13/19 0316 07/13/19 0500 07/13/19 0700  BP:  120/77    Pulse:  97  76  Resp:  20    Temp:  97.8 F (36.6 C)    TempSrc:  Oral    SpO2: 95% 94% 98% 98%  Weight:      Height:        Current Vital Signs 24h Vital Sign Ranges  T 97.8 F (36.6 C) Temp  Avg: 98 F (36.7 C)  Min: 97.6 F (36.4 C)  Max: 98.6 F (37 C)  BP 120/77 BP  Min: 116/71  Max: 139/85  HR 76 Pulse  Avg: 83.2  Min: 69  Max: 97  RR 20 Resp  Avg: 18.4  Min: 13  Max: 21  SaO2 98 % Room Air SpO2  Avg: 97.5 %  Min: 94 %  Max: 100 %       24 Hour I/O Current Shift I/O  Time Ins Outs 12/30 0701 - 12/31 0700 In: 2156.3 [I.V.:1806.3] Out: 1675 [Urine:1225] No intake/output data recorded.  General: NAD Pulmonary: no increased work of breathing Abdomen: non-distended, non-tender, fundus firm at level of umbilicus Inc: Clean/dry/intact, The marks on the pressure dressing where fluid has leaked  appear old and within the previous markings. OnQ pump in place without leaking.  Extremities: no edema, no erythema, no tenderness  Labs:  Recent Labs  Lab 07/10/19 2248 07/12/19 0616 07/13/19 0537  WBC 11.3* 10.1 10.3  HGB 11.6* 12.3 10.9*  HCT 35.2* 37.0 31.5*  PLT 114* 110* 116*     Assessment:   23 y.o. G2P1011 postoperative day # 1 status post primary cesarean section, Gestation hypertension, gestational thrombocytopenia.  Plan:  1) Acute blood loss anemia - hemodynamically stable and asymptomatic - po ferrous sulfate  2) O POS / Rubella 15.80 (06/11 1407)/ Varicella Immune  3) TDAP status: received 05/24/2019 Influenza status: freceived 04/14/2019  4) breast feeding /Contraception = plans for no contraception at this time.  Needs counseling regarding timing of future deliveries.  5) Gestational thrombocytopenia: stable. Brandi monitor.  6) Disposition: Home POD#2-3  Brandi Bonnet, MD, [facility] 07/13/2019 8:18 AM

## 2019-07-13 NOTE — Anesthesia Post-op Follow-up Note (Signed)
  Anesthesia Pain Follow-up Note  Patient: Brandi Watkins  Day #: 1  Date of Follow-up: 07/13/2019 Time: 7:18 AM  Last Vitals:  Vitals:   07/13/19 0316 07/13/19 0500  BP: 120/77   Pulse: 97   Resp: 20   Temp: 36.6 C   SpO2: 94% 98%    Level of Consciousness: alert  Pain: mild   Side Effects:None  Catheter Site Exam:clean     Plan: D/C from anesthesia care at surgeon's request  Brantley Fling

## 2019-07-14 ENCOUNTER — Ambulatory Visit: Payer: Self-pay

## 2019-07-14 MED ORDER — OXYCODONE-ACETAMINOPHEN 5-325 MG PO TABS: 1.0000 | ORAL_TABLET | ORAL | 0 refills | Status: DC | PRN

## 2019-07-14 NOTE — Progress Notes (Signed)
Pt discharged with infant. Discharge instructions, prescriptions, and follow up appointments given to and reviewed with patient. Pt verbalized understanding. To be escorted out by auxillary.  °

## 2019-07-14 NOTE — Progress Notes (Signed)
Admit Date: 07/10/2019 Today's Date: 07/14/2019  Subjective: Postpartum Day 2: Cesarean Delivery Patient reports tolerating PO, + flatus and no problems voiding.    Objective: Vital signs in last 24 hours: Temp:  [98 F (36.7 C)-98.1 F (36.7 C)] 98.1 F (36.7 C) (01/01 0812) Pulse Rate:  [79-96] 96 (01/01 0812) Resp:  [19-20] 20 (01/01 0812) BP: (130-142)/(86-96) 136/86 (01/01 0812) SpO2:  [97 %-100 %] 97 % (01/01 [id number])  Physical Exam:  General: alert and cooperative Lochia: appropriate Uterine Fundus: firm Incision: healing well DVT Evaluation: No evidence of DVT seen on physical exam.  Recent Labs    07/12/19 0616 07/13/19 0537  HGB 12.3 10.9*  HCT 37.0 31.5*    Assessment/Plan: Status post Cesarean section. Doing well postoperatively.  Continue current care.  Hoyt Koch 07/14/2019, 10:10 AM

## 2019-07-14 NOTE — Lactation Note (Signed)
This note was copied from a baby's chart. Lactation Consultation Note  Patient Name: Brandi Watkins Today's Date: 07/14/2019  Mom and baby for d/c today and they are ready to go home, mom concerned that breastmilk is not going into bottle when she pumps like it did yesterday, only around shield, she has a breast pump for use at home [facility]?) so will not be using a Medela pump unless she obtains one through [facility], I informed her that it is normal for colostrum to collect around flange at times in the first few days, that she will not always pump enough to fill bottle, size of flange 30m is appropriate, encouraged her to take this kit home with her if needed for use with [employer] pump, she states baby is nursing better with use of nipple shield and she hears a lot of swallows, encouraged to attempt latching without shield approx. 1 x per day, given Medela instruction sheet for nipple shield and an additional 24 mm shield.  I encouraged frequent feedings at the breast as breasts are beginning to fill and are sl firm, questions answered      Maternal Data    Feeding Feeding Type: Breast Fed  [facility]                   Interventions    Lactation Tools Discussed/Used     Consult Status      MFerol Luz1/07/2019, 2:45 PM

## 2019-07-16 NOTE — Progress Notes (Signed)
ROB at 37wk4d: Increased pelvic pressure. No regular contractions or vaginal bleeding. Baby active. Complains of hemorrhoidal discomfort. Denies headaches, visual changes, RUQ pain, SOB or CP.Using Tucks pads with little relief. Wants to breast feed. Contraception: condoms +GBS BP 130/80, 138/80. Trace proteinuria Questionable deceleration in FHR when auscultating with DT.  NST reactive with accelerations to 160s to 170s, with moderate variability, no decelerations External hemorrhoids present, no inflammation or thrombosis Cervix: Closed/ 50%/-1 to -2  A: IUP at 37wk4d with borderline BP elevation Hemorrhoidal discomfort  P: RTO 2 days for BP check Preeclampsia precautions Labor precautions Proctocort ointment for hemorrhoidal pain.  Dalia Heading, CNM

## 2019-07-17 ENCOUNTER — Telehealth: Payer: Self-pay

## 2019-07-17 NOTE — Telephone Encounter (Signed)
Pricilla, pt mom, reports patient is supposed to remove her pain pump today. She is requesting a call back to assure they are doing it correctly. Cb#[phone]

## 2019-07-17 NOTE — Telephone Encounter (Addendum)
Spoke w/patient. She has already removed the pump. Everything went well. No problems. She has f/u incision check on 07/20/2019

## 2019-07-20 ENCOUNTER — Encounter: Payer: Self-pay | Admitting: Obstetrics and Gynecology

## 2019-07-20 ENCOUNTER — Ambulatory Visit (INDEPENDENT_AMBULATORY_CARE_PROVIDER_SITE_OTHER): Payer: Medicaid Other | Admitting: Obstetrics and Gynecology

## 2019-07-20 ENCOUNTER — Other Ambulatory Visit: Payer: Self-pay

## 2019-07-20 DIAGNOSIS — Z09 Encounter for follow-up examination after completed treatment for conditions other than malignant neoplasm: Secondary | ICD-10-CM

## 2019-07-20 DIAGNOSIS — O1493 Unspecified pre-eclampsia, third trimester: Secondary | ICD-10-CM

## 2019-07-20 NOTE — Progress Notes (Signed)
Obstetrics & Gynecology Office Visit   Chief Complaint  Patient presents with  . Blood Pressure Check    History of Present Illness: 25 y.o. G61P1011 female who is 8 days postpartum from a primary cesarean section. Her late pregnancy was complicated by preeclampsia without severe features. She presents today for a blood pressure check. She was not discharged home on antihypertensive medication.  She denies headaches, visual changes, and RUQ  Pain.  She denies fever, chills, nausea and vomiting. Eating a regular diet without difficulty. The patient is not having any pain.  Activity: increasing slowly. She denies issues with her incision.     Past Medical History:  Diagnosis Date  . ADHD (attention deficit hyperactivity disorder)   . Anemia   . Chlamydia contact, treated 11/2016    Past Surgical History:  Procedure Laterality Date  . CESAREAN SECTION N/A 07/12/2019   Procedure: CESAREAN SECTION;  Surgeon: Will Bonnet, MD;  Location: [facility] ORS;  Service: Obstetrics;  Laterality: N/A;  . NONE      Gynecologic History: No LMP recorded.  Obstetric History: G2P1011  Family History  Problem Relation Age of Onset  . Diabetes Mother   . Hypertension Mother   . Asthma Maternal Aunt   . Hypertension Maternal Grandmother     Social History   Socioeconomic History  . Marital status: Single    Spouse name: Not on file  . Number of children: Not on file  . Years of education: Not on file  . Highest education level: Not on file  Occupational History  . Occupation: [employer]  Tobacco Use  . Smoking status: Never Smoker  . Smokeless tobacco: Never Used  Substance and Sexual Activity  . Alcohol use: Not Currently  . Drug use: Not Currently    Types: Marijuana    Comment: Daily -quite when she found out pregnant  . Sexual activity: Yes  Other Topics Concern  . Not on file  Social History Narrative  . Not on file   Social Determinants of Health   Financial  Resource Strain:   . Difficulty of Paying Living Expenses: Not on file  Food Insecurity:   . Worried About Charity fundraiser in the Last Year: Not on file  . Ran Out of Food in the Last Year: Not on file  Transportation Needs:   . Lack of Transportation (Medical): Not on file  . Lack of Transportation (Non-Medical): Not on file  Physical Activity:   . Days of Exercise per Week: Not on file  . Minutes of Exercise per Session: Not on file  Stress:   . Feeling of Stress : Not on file  Social Connections:   . Frequency of Communication with Friends and Family: Not on file  . Frequency of Social Gatherings with Friends and Family: Not on file  . Attends Religious Services: Not on file  . Active Member of Clubs or Organizations: Not on file  . Attends Archivist Meetings: Not on file  . Marital Status: Not on file  Intimate Partner Violence:   . Fear of Current or Ex-Partner: Not on file  . Emotionally Abused: Not on file  . Physically Abused: Not on file  . Sexually Abused: Not on file    No Known Allergies  Prior to Admission medications   Medication Sig Start Date End Date Taking? Authorizing Provider  Prenatal Vit-Fe Fumarate-FA (MULTIVITAMIN-PRENATAL) 27-0.8 MG TABS tablet Take 1 tablet by mouth daily at 12 noon.  Yes [provider]  hydrocortisone cream 1 % Apply 1 application topically 4 (four) times daily. For hemorrhoidal discomfort Patient not taking: Reported on 07/20/2019 07/10/19   Dalia Heading, CNM  oxyCODONE-acetaminophen (PERCOCET/ROXICET) 5-325 MG tablet Take 1 tablet by mouth every 4 (four) hours as needed for moderate pain (pain score 4-7/10). Patient not taking: Reported on 07/20/2019 07/14/19   Gae Dry, MD    Review of Systems  Constitutional: Negative.   HENT: Negative.   Eyes: Negative.   Respiratory: Negative.   Cardiovascular: Negative.   Gastrointestinal: Negative.   Genitourinary: Negative.   Musculoskeletal: Negative.    Skin: Negative.   Neurological: Negative.   Psychiatric/Behavioral: Negative.      Physical Exam BP (!) 140/98   Wt 179 lb (81.2 kg)   Breastfeeding Yes   BMI 32.74 kg/m  No LMP recorded. Physical Exam Constitutional:      General: She is not in acute distress.    Appearance: Normal appearance.  HENT:     Head: Normocephalic and atraumatic.  Eyes:     General: No scleral icterus.    Conjunctiva/sclera: Conjunctivae normal.  Abdominal:     General: There is no distension.     Palpations: Abdomen is soft.     Tenderness: There is no abdominal tenderness.     Comments: Uterus at U-3 without erythema, induration, warmth, and tenderness. It is clean, dry, and intact.  SteriStrips removed today without issue.  Neurological:     General: No focal deficit present.     Mental Status: She is alert and oriented to person, place, and time.     Cranial Nerves: No cranial nerve deficit.  Psychiatric:        Mood and Affect: Mood normal.        Behavior: Behavior normal.        Judgment: Judgment normal.     Female chaperone present for pelvic and breast  portions of the physical exam  Assessment: 24 y.o. G74P1011 female here for  1. Postpartum follow-up   2. Postop check   3. Preeclampsia, third trimester      Plan: Problem List Items Addressed This Visit      Cardiovascular and Mediastinum   Preeclampsia, third trimester    Other Visit Diagnoses    Postpartum follow-up    -  Primary   Postop check         From a post-operative standpoint, she is doing well. Incision looks like it is healing well.  Incision care instructions provided. Blood pressure still mildly elevated. No symptoms. Follow up BP in 2 weeks.  Precautions for headache, visual changes, and RUQ pain discussed.   Prentice Docker, MD 07/20/2019 2:31 PM

## 2019-08-04 ENCOUNTER — Ambulatory Visit (INDEPENDENT_AMBULATORY_CARE_PROVIDER_SITE_OTHER): Payer: Medicaid Other | Admitting: Obstetrics and Gynecology

## 2019-08-04 ENCOUNTER — Other Ambulatory Visit: Payer: Self-pay

## 2019-08-04 ENCOUNTER — Encounter: Payer: Self-pay | Admitting: Obstetrics and Gynecology

## 2019-08-04 VITALS — BP 128/84 | Ht 62.0 in | Wt 178.0 lb

## 2019-08-04 DIAGNOSIS — Z09 Encounter for follow-up examination after completed treatment for conditions other than malignant neoplasm: Secondary | ICD-10-CM

## 2019-08-04 DIAGNOSIS — O1493 Unspecified pre-eclampsia, third trimester: Secondary | ICD-10-CM

## 2019-08-04 NOTE — Progress Notes (Signed)
Obstetrics & Gynecology Office Visit   Chief Complaint  Patient presents with  . Postpartum Care  . Blood Pressure Check    History of Present Illness: 24 y.o. G93P1011 female who is 3 weeks postpartum from a primary cesarean section. Her late pregnancy was complicated by preeclampsia without severe features. She presents today for a blood pressure check. She was not discharged home on antihypertensive medication.  She denies headaches, visual changes, and RUQ  Pain.  She denies fever, chills, nausea and vomiting. Eating a regular diet without difficulty. The patient is not having any pain.  Activity: increasing slowly. She denies issues with her incision.     Past Medical History:  Diagnosis Date  . ADHD (attention deficit hyperactivity disorder)   . Anemia   . Chlamydia contact, treated 11/2016    Past Surgical History:  Procedure Laterality Date  . CESAREAN SECTION N/A 07/12/2019   Procedure: CESAREAN SECTION;  Surgeon: Will Bonnet, MD;  Location: [facility] ORS;  Service: Obstetrics;  Laterality: N/A;  . NONE      Gynecologic History: No LMP recorded.  Obstetric History: G2P1011  Family History  Problem Relation Age of Onset  . Diabetes Mother   . Hypertension Mother   . Asthma Maternal Aunt   . Hypertension Maternal Grandmother     Social History   Socioeconomic History  . Marital status: Single    Spouse name: Not on file  . Number of children: Not on file  . Years of education: Not on file  . Highest education level: Not on file  Occupational History  . Occupation: [employer]  Tobacco Use  . Smoking status: Never Smoker  . Smokeless tobacco: Never Used  Substance and Sexual Activity  . Alcohol use: Not Currently  . Drug use: Not Currently    Types: Marijuana    Comment: Daily -quite when she found out pregnant  . Sexual activity: Yes  Other Topics Concern  . Not on file  Social History Narrative  . Not on file   Social Determinants of  Health   Financial Resource Strain:   . Difficulty of Paying Living Expenses: Not on file  Food Insecurity:   . Worried About Charity fundraiser in the Last Year: Not on file  . Ran Out of Food in the Last Year: Not on file  Transportation Needs:   . Lack of Transportation (Medical): Not on file  . Lack of Transportation (Non-Medical): Not on file  Physical Activity:   . Days of Exercise per Week: Not on file  . Minutes of Exercise per Session: Not on file  Stress:   . Feeling of Stress : Not on file  Social Connections:   . Frequency of Communication with Friends and Family: Not on file  . Frequency of Social Gatherings with Friends and Family: Not on file  . Attends Religious Services: Not on file  . Active Member of Clubs or Organizations: Not on file  . Attends Archivist Meetings: Not on file  . Marital Status: Not on file  Intimate Partner Violence:   . Fear of Current or Ex-Partner: Not on file  . Emotionally Abused: Not on file  . Physically Abused: Not on file  . Sexually Abused: Not on file    No Known Allergies  Prior to Admission medications   Medication Sig Start Date End Date Taking? Authorizing Provider  Prenatal Vit-Fe Fumarate-FA (MULTIVITAMIN-PRENATAL) 27-0.8 MG TABS tablet Take 1 tablet by mouth daily  at 12 noon.   Yes [provider]  hydrocortisone cream 1 % Apply 1 application topically 4 (four) times daily. For hemorrhoidal discomfort Patient not taking: Reported on 07/20/2019 07/10/19   Dalia Heading, CNM  oxyCODONE-acetaminophen (PERCOCET/ROXICET) 5-325 MG tablet Take 1 tablet by mouth every 4 (four) hours as needed for moderate pain (pain score 4-7/10). Patient not taking: Reported on 07/20/2019 07/14/19   Gae Dry, MD    Review of Systems  Constitutional: Negative.   HENT: Negative.   Eyes: Negative.   Respiratory: Negative.   Cardiovascular: Negative.   Gastrointestinal: Negative.   Genitourinary: Negative.    Musculoskeletal: Negative.   Skin: Negative.   Neurological: Negative.   Psychiatric/Behavioral: Negative.      Physical Exam BP 128/84   Ht 5\' 2"  (1.575 m)   Wt 178 lb (80.7 kg)   BMI 32.56 kg/m  No LMP recorded. Physical Exam Constitutional:      General: She is not in acute distress.    Appearance: Normal appearance.  HENT:     Head: Normocephalic and atraumatic.  Eyes:     General: No scleral icterus.    Conjunctiva/sclera: Conjunctivae normal.  Abdominal:     General: There is no distension.     Palpations: Abdomen is soft.     Tenderness: There is no abdominal tenderness.     Comments: Incision: without erythema, induration, warmth, and tenderness. It is clean, dry, and intact.    Neurological:     General: No focal deficit present.     Mental Status: She is alert and oriented to person, place, and time.     Cranial Nerves: No cranial nerve deficit.  Psychiatric:        Mood and Affect: Mood normal.        Behavior: Behavior normal.        Judgment: Judgment normal.     Female chaperone present for pelvic and breast  portions of the physical exam  Assessment: 24 y.o. G27P1011 female here for  1. Postop check   2. Preeclampsia, third trimester      Plan: Problem List Items Addressed This Visit      Cardiovascular and Mediastinum   Preeclampsia, third trimester    Other Visit Diagnoses    Postop check    -  Primary     From a post-operative standpoint, she is doing well. Incision looks like it is healing well.  Incision care instructions provided. Blood pressure normal today.  Return in about 3 weeks (around 08/25/2019) for Six Week Postpartum.   Prentice Docker, MD 08/04/2019 9:49 AM

## 2019-08-10 ENCOUNTER — Telehealth: Payer: Self-pay

## 2019-08-10 NOTE — Telephone Encounter (Signed)
Pt called reporting heaving bleeding. Put on Dr Glennon Mac schedule for tomorrow, pt advised if it get too heavy or she feels light headed go to er

## 2019-08-11 ENCOUNTER — Ambulatory Visit (INDEPENDENT_AMBULATORY_CARE_PROVIDER_SITE_OTHER): Payer: Medicaid Other | Admitting: Obstetrics and Gynecology

## 2019-08-11 ENCOUNTER — Other Ambulatory Visit: Payer: Self-pay

## 2019-08-11 ENCOUNTER — Encounter: Payer: Self-pay | Admitting: Obstetrics and Gynecology

## 2019-08-11 DIAGNOSIS — N938 Other specified abnormal uterine and vaginal bleeding: Secondary | ICD-10-CM

## 2019-08-11 DIAGNOSIS — R1909 Other intra-abdominal and pelvic swelling, mass and lump: Secondary | ICD-10-CM

## 2019-08-11 NOTE — Progress Notes (Signed)
Obstetrics & Gynecology Office Visit   Chief Complaint  Patient presents with  . Postpartum Care  bleeding  History of Present Illness: 24 y.o. G15P1011 female who presents 4 weeks postpartum from a C-section with new onset heavy vaginal bleeding.  The bleeding started a couple days ago and she states that initially she was soaking through a pad an hour.  The bleeding is not that heavy today, but is still like a period.  She denies fevers, chills, abdominal pain.  She does not feel lightheaded or weak.  She is currently breast-feeding exclusively.   Past Medical History:  Diagnosis Date  . ADHD (attention deficit hyperactivity disorder)   . Anemia   . Chlamydia contact, treated 11/2016    Past Surgical History:  Procedure Laterality Date  . CESAREAN SECTION N/A 07/12/2019   Procedure: CESAREAN SECTION;  Surgeon: Will Bonnet, MD;  Location: [facility] ORS;  Service: Obstetrics;  Laterality: N/A;  . NONE      Gynecologic History: No LMP recorded.  Obstetric History: G2P1011  Family History  Problem Relation Age of Onset  . Diabetes Mother   . Hypertension Mother   . Asthma Maternal Aunt   . Hypertension Maternal Grandmother     Social History   Socioeconomic History  . Marital status: Single    Spouse name: Not on file  . Number of children: Not on file  . Years of education: Not on file  . Highest education level: Not on file  Occupational History  . Occupation: [employer]  Tobacco Use  . Smoking status: Never Smoker  . Smokeless tobacco: Never Used  Substance and Sexual Activity  . Alcohol use: Not Currently  . Drug use: Not Currently    Types: Marijuana    Comment: Daily -quite when she found out pregnant  . Sexual activity: Yes  Other Topics Concern  . Not on file  Social History Narrative  . Not on file   Social Determinants of Health   Financial Resource Strain:   . Difficulty of Paying Living Expenses: Not on file  Food Insecurity:   .  Worried About Charity fundraiser in the Last Year: Not on file  . Ran Out of Food in the Last Year: Not on file  Transportation Needs:   . Lack of Transportation (Medical): Not on file  . Lack of Transportation (Non-Medical): Not on file  Physical Activity:   . Days of Exercise per Week: Not on file  . Minutes of Exercise per Session: Not on file  Stress:   . Feeling of Stress : Not on file  Social Connections:   . Frequency of Communication with Friends and Family: Not on file  . Frequency of Social Gatherings with Friends and Family: Not on file  . Attends Religious Services: Not on file  . Active Member of Clubs or Organizations: Not on file  . Attends Archivist Meetings: Not on file  . Marital Status: Not on file  Intimate Partner Violence:   . Fear of Current or Ex-Partner: Not on file  . Emotionally Abused: Not on file  . Physically Abused: Not on file  . Sexually Abused: Not on file    No Known Allergies  Prior to Admission medications   Medication Sig Start Date End Date Taking? Authorizing Provider  Prenatal Vit-Fe Fumarate-FA (MULTIVITAMIN-PRENATAL) 27-0.8 MG TABS tablet Take 1 tablet by mouth daily at 12 noon.   Yes [provider]  hydrocortisone cream 1 %  Apply 1 application topically 4 (four) times daily. For hemorrhoidal discomfort Patient not taking: Reported on 07/20/2019 07/10/19   Dalia Heading, CNM  oxyCODONE-acetaminophen (PERCOCET/ROXICET) 5-325 MG tablet Take 1 tablet by mouth every 4 (four) hours as needed for moderate pain (pain score 4-7/10). Patient not taking: Reported on 07/20/2019 07/14/19   Gae Dry, MD    Review of Systems  Constitutional: Negative.   HENT: Negative.   Eyes: Negative.   Respiratory: Negative.   Cardiovascular: Negative.   Gastrointestinal: Negative.   Genitourinary: Negative.        See HPI  Musculoskeletal: Negative.   Skin: Negative.   Neurological: Negative.   Psychiatric/Behavioral:  Negative.      Physical Exam BP 122/74   Ht 5\' 2"  (1.575 m)   Wt 177 lb (80.3 kg)   BMI 32.37 kg/m  No LMP recorded. Physical Exam Constitutional:      General: She is not in acute distress.    Appearance: Normal appearance.  HENT:     Head: Normocephalic and atraumatic.  Eyes:     General: No scleral icterus.    Conjunctiva/sclera: Conjunctivae normal.  Abdominal:     General: There is no distension.     Palpations: Abdomen is soft. There is mass (uterus about 2 cm above pubic symphysis).     Tenderness: There is no abdominal tenderness. There is no guarding or rebound.  Neurological:     General: No focal deficit present.     Mental Status: She is alert and oriented to person, place, and time.     Cranial Nerves: No cranial nerve deficit.  Psychiatric:        Mood and Affect: Mood normal.        Behavior: Behavior normal.        Judgment: Judgment normal.     Assessment: 24 y.o. G47P1011 female here for  1. Postpartum hemorrhage, unspecified type      Plan: Problem List Items Addressed This Visit    None    Visit Diagnoses    Postpartum hemorrhage, unspecified type    -  Primary   Relevant Orders   US PELVIS TRANSVAGINAL NON-OB (TV ONLY)     Since bleeding has slowed down, it is unlikely to be a delayed postpartum hemorrhage.  She has no indicators of infection.  She had a C-section so retained products of conception are less likely, but not impossible.  To this end, will obtain a pelvic ultrasound to assess.  If her bleeding has improved by the time her scheduled pelvic ultrasound is to occur, she may cancel the appointment and reschedule her bleeding starts again.  Precautions for fever, chills, abdominal pain, bleeding such that she soaks a pad every hour for more than 2 hours or given.  Prentice Docker, MD 08/11/2019 1:49 PM

## 2019-08-18 ENCOUNTER — Other Ambulatory Visit: Payer: Medicaid Other

## 2019-08-18 ENCOUNTER — Ambulatory Visit: Payer: Medicaid Other | Admitting: Obstetrics and Gynecology

## 2019-09-04 ENCOUNTER — Other Ambulatory Visit: Payer: Self-pay

## 2019-09-04 ENCOUNTER — Encounter: Payer: Self-pay | Admitting: Obstetrics and Gynecology

## 2019-09-04 ENCOUNTER — Ambulatory Visit (INDEPENDENT_AMBULATORY_CARE_PROVIDER_SITE_OTHER): Payer: Medicaid Other | Admitting: Obstetrics and Gynecology

## 2019-09-04 DIAGNOSIS — R87612 Low grade squamous intraepithelial lesion on cytologic smear of cervix (LGSIL): Secondary | ICD-10-CM

## 2019-09-04 DIAGNOSIS — Z30011 Encounter for initial prescription of contraceptive pills: Secondary | ICD-10-CM

## 2019-09-04 MED ORDER — NORETHINDRONE 0.35 MG PO TABS: 1.0000 | ORAL_TABLET | Freq: Every day | ORAL | 4 refills | Status: DC

## 2019-09-04 NOTE — Progress Notes (Signed)
Postpartum Visit   Chief Complaint  Patient presents with  . Postpartum Care    C\S 12/30   History of Present Illness: Patient is a 24 y.o. G2P1011 presents for postpartum visit.  Date of delivery: 07/12/2019 Type of delivery: Cesarean section for fetal intolerance of labor Episiotomy No.  Laceration: not applicable  Pregnancy or labor problems: Preeclampsia without severe features Any problems since the delivery:  Heavy vaginal bleeding  Newborn Details:  SINGLETON :  1. Baby:: female Birth weight: 2,560 grams (5 lb 10.3 oz) Maternal Details:  Breast Feeding:  yes Post partum depression/anxiety noted:  no Edinburgh Post-Partum Depression Score:  0  Date of last PAP: 01/12/2019  LGSIL   Past Medical History:  Diagnosis Date  . ADHD (attention deficit hyperactivity disorder)   . Anemia   . Chlamydia contact, treated 11/2016    Past Surgical History:  Procedure Laterality Date  . CESAREAN SECTION N/A 07/12/2019   Procedure: CESAREAN SECTION;  Surgeon: Will Bonnet, MD;  Location: [facility] ORS;  Service: Obstetrics;  Laterality: N/A;  . NONE      Prior to Admission medications   Medication Sig Start Date End Date Taking? Authorizing Provider  Prenatal Vit-Fe Fumarate-FA (MULTIVITAMIN-PRENATAL) 27-0.8 MG TABS tablet Take 1 tablet by mouth daily at 12 noon.   Yes [provider]    No Known Allergies   Social History   Socioeconomic History  . Marital status: Single    Spouse name: Not on file  . Number of children: Not on file  . Years of education: Not on file  . Highest education level: Not on file  Occupational History  . Occupation: [employer]  Tobacco Use  . Smoking status: Never Smoker  . Smokeless tobacco: Never Used  Substance and Sexual Activity  . Alcohol use: Not Currently  . Drug use: Not Currently    Types: Marijuana    Comment: Daily -quite when she found out pregnant  . Sexual activity: Yes    Birth control/protection: None   Other Topics Concern  . Not on file  Social History Narrative  . Not on file   Social Determinants of Health   Financial Resource Strain:   . Difficulty of Paying Living Expenses: Not on file  Food Insecurity:   . Worried About Charity fundraiser in the Last Year: Not on file  . Ran Out of Food in the Last Year: Not on file  Transportation Needs:   . Lack of Transportation (Medical): Not on file  . Lack of Transportation (Non-Medical): Not on file  Physical Activity:   . Days of Exercise per Week: Not on file  . Minutes of Exercise per Session: Not on file  Stress:   . Feeling of Stress : Not on file  Social Connections:   . Frequency of Communication with Friends and Family: Not on file  . Frequency of Social Gatherings with Friends and Family: Not on file  . Attends Religious Services: Not on file  . Active Member of Clubs or Organizations: Not on file  . Attends Archivist Meetings: Not on file  . Marital Status: Not on file  Intimate Partner Violence:   . Fear of Current or Ex-Partner: Not on file  . Emotionally Abused: Not on file  . Physically Abused: Not on file  . Sexually Abused: Not on file    Family History  Problem Relation Age of Onset  . Diabetes Mother   . Hypertension Mother   .  Asthma Maternal Aunt   . Hypertension Maternal Grandmother     Review of Systems  Constitutional: Negative.   HENT: Negative.   Eyes: Negative.   Respiratory: Negative.   Cardiovascular: Negative.   Gastrointestinal: Negative.   Genitourinary: Negative.   Musculoskeletal: Negative.   Skin: Negative.   Neurological: Negative.   Psychiatric/Behavioral: Negative.      Physical Exam BP 122/70   Ht 5\' 2"  (1.575 m)   Wt 175 lb 8 oz (79.6 kg)   LMP 09/04/2019 (Exact Date)   Breastfeeding Yes   BMI 32.10 kg/m   Physical Exam Constitutional:      General: She is not in acute distress.    Appearance: Normal appearance. She is well-developed.  Genitourinary:      Pelvic exam was performed with patient in the lithotomy position.     Vulva, inguinal canal, urethra, bladder, vagina, uterus, right adnexa and left adnexa normal.     No posterior fourchette tenderness, injury or lesion present.     Vaginal exam comments: Dark blood, medium amount in vaginal vault .     No cervical friability, lesion, bleeding or polyp.  HENT:     Head: Normocephalic and atraumatic.  Eyes:     General: No scleral icterus.    Conjunctiva/sclera: Conjunctivae normal.  Cardiovascular:     Rate and Rhythm: Normal rate and regular rhythm.     Heart sounds: No murmur. No friction rub. No gallop.   Pulmonary:     Effort: Pulmonary effort is normal. No respiratory distress.     Breath sounds: Normal breath sounds. No wheezing or rales.  Abdominal:     General: Bowel sounds are normal. There is no distension.     Palpations: Abdomen is soft. There is no mass.     Tenderness: There is no abdominal tenderness. There is no guarding or rebound.     Comments: without erythema, induration, warmth, and tenderness. It is clean, dry, and intact.    Musculoskeletal:        General: Normal range of motion.     Cervical back: Normal range of motion and neck supple.  Neurological:     General: No focal deficit present.     Mental Status: She is alert and oriented to person, place, and time.     Cranial Nerves: No cranial nerve deficit.  Skin:    General: Skin is warm and dry.     Findings: No erythema.  Psychiatric:        Mood and Affect: Mood normal.        Behavior: Behavior normal.        Judgment: Judgment normal.      Female Chaperone present during breast and/or pelvic exam.  Assessment: 24 y.o. G2P1011 presenting for 6 week postpartum visit  Plan: Problem List Items Addressed This Visit    None    Visit Diagnoses    Postpartum care and examination    -  Primary   Relevant Medications   norethindrone (MICRONOR) 0.35 MG tablet   LGSIL on Pap smear of cervix        Encounter for initial prescription of contraceptive pills       Relevant Medications   norethindrone (MICRONOR) 0.35 MG tablet      1) Contraception Education given regarding options for contraception, including oral contraceptives.  2)  Pap - ASCCP guidelines and rational discussed.  Unable to perform pap smear today due to menstruation.  Long discussion held regarding importance  of follow up. She will come back soon to have repeat pap smear performed.   3) Patient underwent screening for postpartum depression with no concerns noted.  Return in about 2 weeks (around 09/18/2019) for pap smear with Dr. Glennon Mac.   Prentice Docker, MD 09/04/2019 12:19 PM

## 2019-09-20 ENCOUNTER — Other Ambulatory Visit (HOSPITAL_COMMUNITY)
Admission: RE | Admit: 2019-09-20 | Discharge: 2019-09-20 | Disposition: A | Discharge status: Home / Self Care | Payer: Medicaid Other | Source: Ambulatory Visit | Attending: Obstetrics and Gynecology | Admitting: Obstetrics and Gynecology

## 2019-09-20 ENCOUNTER — Encounter: Payer: Self-pay | Admitting: Obstetrics and Gynecology

## 2019-09-20 ENCOUNTER — Other Ambulatory Visit: Payer: Self-pay

## 2019-09-20 ENCOUNTER — Ambulatory Visit (INDEPENDENT_AMBULATORY_CARE_PROVIDER_SITE_OTHER): Payer: Medicaid Other | Admitting: Obstetrics and Gynecology

## 2019-09-20 VITALS — BP 118/74 | Ht 62.0 in | Wt 172.0 lb

## 2019-09-20 DIAGNOSIS — Z113 Encounter for screening for infections with a predominantly sexual mode of transmission: Secondary | ICD-10-CM | POA: Insufficient documentation

## 2019-09-20 DIAGNOSIS — R87612 Low grade squamous intraepithelial lesion on cytologic smear of cervix (LGSIL): Secondary | ICD-10-CM | POA: Diagnosis present

## 2019-09-20 DIAGNOSIS — N9089 Other specified noninflammatory disorders of vulva and perineum: Secondary | ICD-10-CM

## 2019-09-20 NOTE — Progress Notes (Signed)
Patient presents for pap smear visit as this was not possible at her last visit due to menstruation. She also notes some new skin lesions in her upper labial area (see picture).    BP 118/74   Ht 5\' 2"  (1.575 m)   Wt 172 lb (78 kg)   LMP 09/04/2019 (Exact Date)   BMI 31.46 kg/m   Physical Exam Genitourinary:     Pelvic exam was performed with patient in the lithotomy position.     Inguinal canal, urethra, vagina and cervix normal.       A/P: Pap smear collected with STD screen Skin lesions: these are new (she noted them in the past month or so). Watch for stability or regression. If progress, may send to dermatology.  If are stable, will discuss plan to remove.  If regress, no need for follow up.  Prentice Docker, MD, [facility], [facility] 09/20/2019 12:35 PM

## 2019-09-22 LAB — CYTOLOGY - PAP
Chlamydia: NEGATIVE
Comment: NEGATIVE
Comment: NEGATIVE
Comment: NORMAL
Diagnosis: NEGATIVE
Neisseria Gonorrhea: NEGATIVE
Trichomonas: POSITIVE — AB

## 2019-09-27 ENCOUNTER — Other Ambulatory Visit: Payer: Self-pay | Admitting: Obstetrics & Gynecology

## 2019-09-27 MED ORDER — METRONIDAZOLE 500 MG PO TABS: 2000.0000 mg | ORAL_TABLET | Freq: Once | ORAL | 0 refills | Status: AC

## 2019-09-27 NOTE — Telephone Encounter (Signed)
Patient's mother is calling to find out if something is going to be sent in and also why the patient hasn't gotten and call back . SDJ is out of office would you please review and contact patient

## 2019-09-27 NOTE — Telephone Encounter (Signed)
Treated trich on PAP

## 2019-09-28 ENCOUNTER — Telehealth: Payer: Self-pay | Admitting: Obstetrics and Gynecology

## 2019-09-28 NOTE — Telephone Encounter (Signed)
Discussed trichomonas on pap smear. Treatment already called in by Dr. Kenton Kingfisher. Questions answered. Recommend partner is treated and wait 1 week after 2nd person is treated to resume intercourse. Recommend follow up pap smear in 09/2020.  Also, if she has any vaginal symptoms in the mean time, she should have follow up testing to ensure a cure.

## 2019-09-29 ENCOUNTER — Ambulatory Visit: Payer: Medicaid Other | Admitting: Family Medicine

## 2019-09-29 ENCOUNTER — Encounter: Payer: Self-pay | Admitting: Family Medicine

## 2019-09-29 ENCOUNTER — Other Ambulatory Visit: Payer: Self-pay

## 2019-09-29 DIAGNOSIS — Z5321 Procedure and treatment not carried out due to patient leaving prior to being seen by health care provider: Secondary | ICD-10-CM

## 2019-09-29 NOTE — Progress Notes (Signed)
I did not see pt. See RN Debera Lat documentation.

## 2019-09-29 NOTE — Progress Notes (Signed)
In for visit; completed med 09/27/19 for Trich-informed too soon for [facility]; desires other screening today Debera Lat, RN Reports unable to stay to see provider-will reschedule appt Debera Lat, RN

## 2019-10-27 ENCOUNTER — Ambulatory Visit: Payer: Medicaid Other | Admitting: Physician Assistant

## 2019-10-27 ENCOUNTER — Other Ambulatory Visit: Payer: Self-pay

## 2019-10-27 DIAGNOSIS — Z113 Encounter for screening for infections with a predominantly sexual mode of transmission: Secondary | ICD-10-CM | POA: Diagnosis not present

## 2019-10-27 LAB — WET PREP FOR TRICH, YEAST, CLUE
Trichomonas Exam: NEGATIVE
Yeast Exam: NEGATIVE

## 2019-10-27 NOTE — Progress Notes (Signed)
Wet mount reviewed, no tx per standing orders. Provider orders completed.

## 2019-10-30 ENCOUNTER — Encounter: Payer: Self-pay | Admitting: Physician Assistant

## 2019-10-30 NOTE — Progress Notes (Signed)
[facility] Department STI clinic/screening visit  Subjective:  Brandi Watkins is a 24 y.o. female being seen today for an STI screening visit. The patient reports they do not have symptoms.  Patient reports that they do not desire a pregnancy in the next year.   They reported they are not interested in discussing contraception today.  No LMP recorded.   Patient has the following medical conditions:   Patient Active Problem List   Diagnosis Date Noted  . Status post cesarean section 07/12/2019  . Indication for care in labor and delivery, antepartum 07/10/2019  . Preeclampsia, third trimester 07/10/2019  . Low grade squamous intraepithelial lesion (LGSIL) on cervical Pap smear 05/24/2019  . Supervision of other normal pregnancy, antepartum 02/08/2019  . 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 SEXUALLY TRANSMITTED DISEASE    STD screening including bloodwork    HPI  Patient reports that she is not having any symptoms but would like a screening today.  States that she is breastfeeding and taking OCs that would not interfere with the breastfeeding.  LMP 10/03/2019 and normal.   See flowsheet for further details and programmatic requirements.    The following portions of the patient's history were reviewed and updated as appropriate: allergies, current medications, past medical history, past social history, past surgical history and problem list.  Objective:  There were no vitals filed for this visit.  Physical Exam Constitutional:      General: She is not in acute distress.    Appearance: Normal appearance.  HENT:     Head: Normocephalic and atraumatic.     Comments: No nits, lice or hair loss. No cervical, supraclavicular or axillary adenopathy.    Mouth/Throat:     Mouth: Mucous membranes are moist.     Pharynx: Oropharynx is clear. No oropharyngeal exudate or posterior oropharyngeal erythema.  Eyes:   Conjunctiva/sclera: Conjunctivae normal.  Pulmonary:     Effort: Pulmonary effort is normal.  Abdominal:     Palpations: Abdomen is soft. There is no mass.     Tenderness: There is no abdominal tenderness. There is no guarding or rebound.  Genitourinary:    General: Normal vulva.     Rectum: Normal.     Comments: External genitalia/pubic area without nits, lice, edema, erythema, lesions and inguinal adenopathy. Vagina with normal mucosa and discharge. Cervix without visible lesions. Uterus firm, mobile, nt, no CMT, no masses, no adnexal tenderness or fullness. Musculoskeletal:     Cervical back: Neck supple. No tenderness.  Skin:    General: Skin is warm and dry.     Findings: No bruising, erythema, lesion or rash.  Neurological:     Mental Status: She is alert and oriented to person, place, and time.  Psychiatric:        Mood and Affect: Mood normal.        Behavior: Behavior normal.        Thought Content: Thought content normal.        Judgment: Judgment normal.      Assessment and Plan:  Brandi Watkins is a 24 y.o. female presenting to the [facility] Department for STI screening  1. Screening for STD (sexually transmitted disease) Patient into clinic without symptoms. Rec condoms with all sex. Await test results.  Counseled that RN will call if needs to RTC for treatment once results are back. - WET PREP FOR [place], YEAST, CLUE - Gonococcus  culture - Chlamydia/Gonorrhea [facility] - HIV [place] [facility] - Syphilis Serology, [facility]     No follow-ups on file.  No future appointments.  Jerene Dilling, PA

## 2019-10-31 LAB — GONOCOCCUS CULTURE

## 2020-01-16 ENCOUNTER — Ambulatory Visit: Payer: Medicaid Other | Admitting: Family Medicine

## 2020-01-16 ENCOUNTER — Encounter: Payer: Self-pay | Admitting: Family Medicine

## 2020-01-16 ENCOUNTER — Other Ambulatory Visit: Payer: Self-pay

## 2020-01-16 DIAGNOSIS — Z113 Encounter for screening for infections with a predominantly sexual mode of transmission: Secondary | ICD-10-CM

## 2020-01-16 LAB — WET PREP FOR TRICH, YEAST, CLUE
Trichomonas Exam: NEGATIVE
Yeast Exam: NEGATIVE

## 2020-01-16 NOTE — Progress Notes (Signed)
Here today for STD screening. Declines bloodwork. Daphene Chisholm, RN ? ?

## 2020-01-16 NOTE — Progress Notes (Signed)
Wet mount reviewed, no tx per standing order. Provider orders completed. 

## 2020-01-16 NOTE — Progress Notes (Signed)
[facility] Department STI clinic/screening visit  Subjective:  Brandi Watkins is a 24 y.o. female being seen today for an STI screening visit. The patient reports they do not have symptoms.  Patient reports that they do not desire a pregnancy in the next year.   They reported they are not interested in discussing contraception today.  Patient's last menstrual period was 12/12/2019 (approximate).   Patient has the following medical conditions:   Patient Active Problem List   Diagnosis Date Noted  . Status post cesarean section 07/12/2019  . Low grade squamous intraepithelial lesion (LGSIL) on cervical Pap smear 05/24/2019  . 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 SEXUALLY TRANSMITTED DISEASE    Screening    HPI  Patient reports she is here for STD screening.  She denies symptoms.  Last HIV test per patient/review of record was unknown Patient reports last pap was unknown   See flowsheet for further details and programmatic requirements.    The following portions of the patient's history were reviewed and updated as appropriate: allergies, current medications, past medical history, past social history, past surgical history and problem list.  Objective:  There were no vitals filed for this visit.  Physical Exam Vitals and nursing note reviewed.  Constitutional:      Appearance: Normal appearance.  HENT:     Head: Normocephalic and atraumatic.     Mouth/Throat:     Mouth: Mucous membranes are moist.     Pharynx: Oropharynx is clear. No oropharyngeal exudate or posterior oropharyngeal erythema.  Pulmonary:     Effort: Pulmonary effort is normal.  Abdominal:     General: Abdomen is flat.     Palpations: There is no mass.     Tenderness: There is no abdominal tenderness. There is no rebound.  Genitourinary:    General: Normal vulva.     Exam position: Lithotomy position.     Pubic Area: No rash or pubic  lice.      Labia:        Right: No rash or lesion.        Left: No rash or lesion.      Vagina: Normal. No vaginal discharge, erythema, bleeding or lesions.     Cervix: No cervical motion tenderness, discharge, friability, lesion or erythema.     Uterus: Normal.      Adnexa: Right adnexa normal and left adnexa normal.     Rectum: Normal.  Lymphadenopathy:     Head:     Right side of head: No preauricular or posterior auricular adenopathy.     Left side of head: No preauricular or posterior auricular adenopathy.     Cervical: No cervical adenopathy.     Upper Body:     Right upper body: No supraclavicular or axillary adenopathy.     Left upper body: No supraclavicular or axillary adenopathy.     Lower Body: No right inguinal adenopathy. No left inguinal adenopathy.  Skin:    General: Skin is warm and dry.     Findings: No rash.  Neurological:     Mental Status: She is alert and oriented to person, place, and time.      Assessment and Plan:  Hannah LUPE HANDLEY is a 24 y.o. female presenting to the [facility] Department for STI screening  1. Screening examination for venereal disease Treat wet prep as per SO. - WET PREP FOR [place], YEAST, CLUE -  Gonococcus culture - Chlamydia/Gonorrhea [facility]     No follow-ups on file.  No future appointments.  Hassell Done, FNP

## 2020-01-21 LAB — GONOCOCCUS CULTURE

## 2020-03-05 ENCOUNTER — Telehealth: Payer: Self-pay

## 2020-03-05 NOTE — Telephone Encounter (Signed)
Pt's mom, Lannette Donath, calling; pt had a baby last year; pt seems to be doing fine but mom would like a referral to a counselor to be sure she doesn't have pp depression b/c it is a lot to handle.  [place] may want pt to see him first before ref out.  Adv SDJ not in office today; is on OR; msg will be sent to him.

## 2020-03-08 NOTE — Telephone Encounter (Signed)
Priscilla aware.  Contact info given for [facility].

## 2020-03-08 NOTE — Telephone Encounter (Signed)
I'm happy to see her.  She might not need referral. Also, if she want to go to [place] disorder clinic, she can call and make the appointment herself without a referral.  I'll provide the information to [facility], if she's interested.

## 2020-03-08 NOTE — Telephone Encounter (Signed)
I was under the impression they did not require this.  The patient can simply call to make an appointment without referral. That is, unless something has changed.

## 2020-03-11 NOTE — Telephone Encounter (Signed)
No worries. I appreciate you closing the loop.

## 2020-04-03 ENCOUNTER — Other Ambulatory Visit: Payer: Self-pay

## 2020-04-03 ENCOUNTER — Ambulatory Visit: Payer: Medicaid Other | Admitting: Advanced Practice Midwife

## 2020-04-03 ENCOUNTER — Ambulatory Visit: Payer: Medicaid Other

## 2020-04-03 DIAGNOSIS — E663 Overweight: Secondary | ICD-10-CM

## 2020-04-03 DIAGNOSIS — Z113 Encounter for screening for infections with a predominantly sexual mode of transmission: Secondary | ICD-10-CM

## 2020-04-03 DIAGNOSIS — Z62810 Personal history of physical and sexual abuse in childhood: Secondary | ICD-10-CM

## 2020-04-03 DIAGNOSIS — R87612 Low grade squamous intraepithelial lesion on cytologic smear of cervix (LGSIL): Secondary | ICD-10-CM

## 2020-04-03 LAB — WET PREP FOR TRICH, YEAST, CLUE
Trichomonas Exam: NEGATIVE
Yeast Exam: NEGATIVE

## 2020-04-03 NOTE — Progress Notes (Signed)
[facility] Department STI clinic/screening visit  Subjective:  Brandi Watkins is a 24 y.o.SBF G2P1 exsmoker female being seen today for an STI screening visit. The patient reports they do not have symptoms.  Patient reports that they do not desire a pregnancy in the next year.   They reported they are not interested in discussing contraception today.  Patient's last menstrual period was 03/29/2020 (exact date).   Patient has the following medical conditions:   Patient Active Problem List   Diagnosis Date Noted  . Overweight 04/03/2020  . Status post cesarean section 07/12/2019  . Hx of preeclampsia with IOL 37 wks 07/10/2019  . Low grade squamous intraepithelial lesion (LGSIL) on cervical Pap smear 05/24/2019  . 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   HPI  Patient reports no sxs.  Last sex 03/20/20 without condom; with current partner x 3 mo.  2 sex partners in last 3 mo.  Last ETOH 01/14/20 (1 Margarita) 1x/mo.  Last MJ 02/2020.  Last LSD 2020.  Last mushrooms 2020.  Last cig 2 years ago.    Last HIV test per patient/review of record was 10/27/19 Patient reports last pap was 09/20/19 neg  See flowsheet for further details and programmatic requirements.    The following portions of the patient's history were reviewed and updated as appropriate: allergies, current medications, past medical history, past social history, past surgical history and problem list.  Objective:  There were no vitals filed for this visit.  Physical Exam Vitals and nursing note reviewed.  Constitutional:      Appearance: Normal appearance.  HENT:     Head: Normocephalic and atraumatic.     Mouth/Throat:     Mouth: Mucous membranes are moist.     Pharynx: Oropharynx is clear. No oropharyngeal exudate or posterior oropharyngeal erythema.  Eyes:     Conjunctiva/sclera: Conjunctivae normal.  Pulmonary:     Effort: Pulmonary effort is normal.   Abdominal:     Palpations: Abdomen is soft. There is no mass.     Tenderness: There is no abdominal tenderness. There is no rebound.     Comments: Soft without masses or tenderness  Genitourinary:    General: Normal vulva.     Exam position: Lithotomy position.     Pubic Area: No rash or pubic lice.      Labia:        Right: No rash or lesion.        Left: No rash or lesion.      Vagina: Vaginal discharge (white creamy leukorrhea, ph<4.5) present. No erythema, bleeding or lesions.     Cervix: Normal.     Uterus: Normal.      Adnexa: Right adnexa normal and left adnexa normal.     Rectum: Normal.  Lymphadenopathy:     Head:     Right side of head: No preauricular or posterior auricular adenopathy.     Left side of head: No preauricular or posterior auricular adenopathy.     Cervical: No cervical adenopathy.     Upper Body:     Right upper body: No supraclavicular or axillary adenopathy.     Left upper body: No supraclavicular or axillary adenopathy.     Lower Body: No right inguinal adenopathy. No left inguinal adenopathy.  Skin:    General: Skin is warm and dry.     Findings: No rash.  Neurological:     Mental Status: She  is alert and oriented to person, place, and time.      Assessment and Plan:  Brandi Watkins is a 24 y.o. female presenting to the [facility] Department for STI screening  1. Overweight   2. Screening examination for venereal disease Treat wet mount per standing orders Immunization nurse consult Please give contact info for [place], [place], YEAST, [facility] - Ambulatory referral to [place]  3. Low grade squamous intraepithelial lesion (LGSIL) on cervical Pap smear 01/12/2019 No colpo Pp pap 09/20/19 neg +trich     No follow-ups on file.  No future appointments.  Herbie Saxon, CNM

## 2020-04-03 NOTE — Progress Notes (Signed)
In for STD screen. Denies symptoms. Declines bloodwork. Results reviewed with provider. No treatment indicated per standing order. Contact number for [facility] given.   Barkley Boards, RN

## 2020-04-12 ENCOUNTER — Encounter: Payer: Self-pay | Admitting: Obstetrics and Gynecology

## 2020-04-12 ENCOUNTER — Ambulatory Visit (INDEPENDENT_AMBULATORY_CARE_PROVIDER_SITE_OTHER): Payer: Medicaid Other | Admitting: Obstetrics and Gynecology

## 2020-04-12 ENCOUNTER — Other Ambulatory Visit: Payer: Self-pay

## 2020-04-12 VITALS — BP 120/70 | Ht 62.0 in | Wt 161.8 lb

## 2020-04-12 DIAGNOSIS — N921 Excessive and frequent menstruation with irregular cycle: Secondary | ICD-10-CM | POA: Diagnosis not present

## 2020-04-12 DIAGNOSIS — N926 Irregular menstruation, unspecified: Secondary | ICD-10-CM | POA: Diagnosis not present

## 2020-04-12 DIAGNOSIS — Z3049 Encounter for surveillance of other contraceptives: Secondary | ICD-10-CM

## 2020-04-12 LAB — POCT URINE PREGNANCY: Preg Test, Ur: NEGATIVE

## 2020-04-12 MED ORDER — ETONOGESTREL-ETHINYL ESTRADIOL 0.12-0.015 MG/24HR VA RING: VAGINAL_RING | VAGINAL | 11 refills | Status: DC

## 2020-04-12 NOTE — Patient Instructions (Signed)
Ethinyl Estradiol; Etonogestrel vaginal ring What is this medicine? ETHINYL ESTRADIOL; ETONOGESTREL (ETH in il es tra DYE ole; et oh noe JES trel) vaginal ring is a flexible, vaginal ring used as a contraceptive (birth control method). This medicine combines 2 types of female hormones, an estrogen and a progestin. This ring is used to prevent ovulation and pregnancy. Each ring is effective for 1 month. This medicine may be used for other purposes; ask your health care provider or pharmacist if you have questions. COMMON BRAND NAME(S): EluRyng, NuvaRing What should I tell my health care provider before I take this medicine? They need to know if you have any of these conditions:  abnormal vaginal bleeding  blood vessel disease or blood clots  breast, cervical, endometrial, ovarian, liver, or uterine cancer  diabetes  gallbladder disease  having surgery  heart disease or recent heart attack  high blood pressure  high cholesterol or triglycerides  history of irregular heartbeat or heart valve problems  kidney disease  liver disease  migraine headaches  protein C deficiency  protein S deficiency  recently had a baby, miscarriage, or abortion  stroke  systemic lupus erythematosus (SLE)  tobacco smoker  your age is more than 24 years old  an unusual or allergic reaction to estrogens, progestins, other medicines, foods, dyes, or preservatives  pregnant or trying to get pregnant  breast-feeding How should I use this medicine? Insert the ring into your vagina as directed. Follow the directions on the prescription label. The ring will remain place for 3 weeks and is then removed for a 1-week break. A new ring is inserted 1 week after the last ring was removed, on the same day of the week. Check often to make sure the ring is still in place. If the ring was out of the vagina for an unknown amount of time, you may not be protected from pregnancy. Perform a pregnancy test and  call your doctor. Do not use more often than directed. A patient package insert for the product will be given with each prescription and refill. Read this sheet carefully each time. The sheet may change frequently. Contact your pediatrician regarding the use of this medicine in children. Special care may be needed. Overdosage: If you think you have taken too much of this medicine contact a poison control center or emergency room at once. NOTE: This medicine is only for you. Do not share this medicine with others. What if I miss a dose? You will need to use the ring exactly as directed. It is very important to follow the schedule every cycle. If you do not use the ring as directed, you may not be protected from pregnancy. If the ring should slip out, is lost, or if you leave it in longer or shorter than you should, contact your health care professional for advice. What may interact with this medicine? Do not take this medicine with the following medications:  dasabuvir; ombitasvir; paritaprevir; ritonavir  ombitasvir; paritaprevir; ritonavir  vaginal lubricants or other vaginal products that are oil-based or silicone-based This medicine may also interact with the following medications:  acetaminophen  antibiotics or medicines for infections, especially rifampin, rifabutin, rifapentine, and griseofulvin, and possibly penicillins or tetracyclines  aprepitant or fosaprepitant  armodafinil  ascorbic acid (vitamin C)  barbiturate medicines, such as phenobarbital or primidone  bosentan  certain antiviral medicines for hepatitis, HIV or AIDS  certain medicines for cancer treatment  certain medicines for seizures like carbamazepine, clobazam, felbamate, lamotrigine, oxcarbazepine, phenytoin,   rufinamide, topiramate  certain medicines for treating high cholesterol  cyclosporine  dantrolene  elagolix  flibanserin  grapefruit juice  lesinurad  medicines for diabetes  medicines  to treat fungal infections, such as griseofulvin, miconazole, fluconazole, ketoconazole, itraconazole, posaconazole or voriconazole  mifepristone  mitotane  modafinil  morphine  mycophenolate  St. John's wort  tamoxifen  temazepam  theophylline or aminophylline  thyroid hormones  tizanidine  tranexamic acid  ulipristal  warfarin This list may not describe all possible interactions. Give your health care provider a list of all the medicines, herbs, non-prescription drugs, or dietary supplements you use. Also tell them if you smoke, drink alcohol, or use illegal drugs. Some items may interact with your medicine. What should I watch for while using this medicine? Visit your doctor or health care professional for regular checks on your progress. You will need a regular breast and pelvic exam and Pap smear while on this medicine. Check with your doctor or health care professional to see if you need an additional method of contraception during the first cycle that you use this ring. Female condoms (made with natural rubber latex, polyisoprene, and polyurethane) and spermicides may be used. Do not use a diaphragm, cervical cap, or a female condom, as the ring can interfere with these birth control methods and their proper placement. If you have any reason to think you are pregnant, stop using this medicine right away and contact your doctor or health care professional. If you are using this medicine for hormone related problems, it may take several cycles of use to see improvement in your condition. Smoking increases the risk of getting a blood clot or having a stroke while you are using hormonal birth control, especially if you are more than 24 years old. You are strongly advised not to smoke. Some women are prone to getting dark patches on the skin of the face (cholasma). Your risk of getting chloasma with this medicine is higher if you had chloasma during a pregnancy. Keep out of the  sun. If you cannot avoid being in the sun, wear protective clothing and use sunscreen. Do not use sun lamps or tanning beds/booths. This medicine can make your body retain fluid, making your fingers, hands, or ankles swell. Your blood pressure can go up. Contact your doctor or health care professional if you feel you are retaining fluid. If you are going to have elective surgery, you may need to stop using this medicine before the surgery. Consult your health care professional for advice. This medicine does not protect you against HIV infection (AIDS) or any other sexually transmitted diseases. What side effects may I notice from receiving this medicine? Side effects that you should report to your doctor or health care professional as soon as possible:  allergic reactions such as skin rash or itching, hives, swelling of the lips, mouth, tongue, or throat  depression  high blood pressure  migraines or severe, sudden headaches  signs and symptoms of a blood clot such as breathing problems; changes in vision; chest pain; severe, sudden headache; pain, swelling, warmth in the leg; trouble speaking; sudden numbness or weakness of the face, arm or leg  signs and symptoms of infection like fever or chills with dizziness and a sunburn-like rash, or pain or trouble passing urine  stomach pain  symptoms of vaginal infection like itching, irritation or unusual discharge  yellowing of the eyes or skin Side effects that usually do not require medical attention (report these to your doctor   or health care professional if they continue or are bothersome):  acne  breast pain, tenderness  irregular vaginal bleeding or spotting, particularly during the first month of use  mild headache  nausea  painful periods  vomiting This list may not describe all possible side effects. Call your doctor for medical advice about side effects. You may report side effects to FDA at 1-800-FDA-1088. Where should I  keep my medicine? Keep out of the reach of children. Store unopened medicine for up to 4 months at room temperature at 15 and 30 degrees C (59 and 86 degrees F). Protect from light. Do not store above 30 degrees C (86 degrees F). Throw away any unused medicine 4 months after the dispense date or the expiration date, whichever comes first. A ring may only be used for 1 cycle (1 month). After the 3-week cycle, a used ring is removed and should be placed in the re-closable foil pouch and discarded in the trash out of reach of children and pets. Do NOT flush down the toilet. NOTE: This sheet is a summary. It may not cover all possible information. If you have questions about this medicine, talk to your doctor, pharmacist, or health care provider.  2020 Elsevier/Gold Standard (2019-01-19 12:31:47)  

## 2020-04-12 NOTE — Progress Notes (Signed)
Patient ID: Brandi Watkins, female   DOB: 1996-01-09, 24 y.o.   MRN: [id number]  Reason for Consult: Gynecologic Exam   Referred by No ref. provider found  Subjective:     HPI:  Brandi Watkins is a 24 y.o. female.  Patient presents today with complaints of irregular bleeding.  She is concerned regarding possible pregnancy or infection.  She was recently tested for infection and her results of gonorrhea and chlamydia are still pending.  She reports that she is 9 months postpartum.  She reports that she has been having menstrual cycle since March 2021.  She reports that she has been intermittently breast feeding.  She is not currently using any birth control methods.  She is having unprotected intercourse and does not desire to be pregnant.  The date of her last intercourse was September 8.  She reports that she took a pregnancy test 2 days ago and it was negative.  She reports that previously her home pregnancy test was negative even though she was pregnant and she would like to be sure that she is not pregnant today.  She reports that her last menstrual.  Was the second or third week of August.  Last week she had 4 days of bleeding like a period and then 3 days later another 4 to 5 days of bleeding.    Past Medical History:  Diagnosis Date  . ADHD (attention deficit hyperactivity disorder)   . Anemia   . Chlamydia contact, treated 11/2016  . Preeclampsia, third trimester 07/10/2019   Family History  Problem Relation Age of Onset  . Diabetes Mother   . Hypertension Mother   . Asthma Maternal Aunt   . Hypertension Maternal Grandmother    Past Surgical History:  Procedure Laterality Date  . CESAREAN SECTION N/A 07/12/2019   Procedure: CESAREAN SECTION;  Surgeon: Will Bonnet, MD;  Location: [facility] ORS;  Service: Obstetrics;  Laterality: N/A;  . NONE      Short Social History:  Social History   Tobacco Use  . Smoking status: Former Smoker    Types: Cigarettes  . Smokeless  tobacco: Never Used  Substance Use Topics  . Alcohol use: Yes    Alcohol/week: 1.0 standard drink    Types: 1 Standard drinks or equivalent per week    Comment: last use 01/14/20    No Known Allergies  Current Outpatient Medications  Medication Sig Dispense Refill  . etonogestrel-ethinyl estradiol (NUVARING) 0.12-0.015 MG/24HR vaginal ring Insert vaginally and leave in place for 3 consecutive weeks, then remove for 1 week. 1 each 11  . norethindrone (MICRONOR) 0.35 MG tablet Take 1 tablet (0.35 mg total) by mouth daily. (Patient not taking: Reported on 04/03/2020) 3 Package 4  . Prenatal Vit-Fe Fumarate-FA (MULTIVITAMIN-PRENATAL) 27-0.8 MG TABS tablet Take 1 tablet by mouth daily at 12 noon. (Patient not taking: Reported on 01/16/2020)     No current facility-administered medications for this visit.    Review of Systems  Constitutional: Negative for chills, fatigue, fever and unexpected weight change.  HENT: Negative for trouble swallowing.  Eyes: Negative for loss of vision.  Respiratory: Negative for cough, shortness of breath and wheezing.  Cardiovascular: Negative for chest pain, leg swelling, palpitations and syncope.  GI: Negative for abdominal pain, blood in stool, diarrhea, nausea and vomiting.  GU: Negative for difficulty urinating, dysuria, frequency and hematuria.  Musculoskeletal: Negative for back pain, leg pain and joint pain.  Skin: Negative for rash.  Neurological: Negative for  dizziness, headaches, light-headedness, numbness and seizures.  Psychiatric: Negative for behavioral problem, confusion, depressed mood and sleep disturbance.        Objective:  Objective   Vitals:   04/12/20 1111  BP: 120/70  Weight: 161 lb 12.8 oz (73.4 kg)  Height: 5\' 2"  (1.575 m)   Body mass index is 29.59 kg/m.  Physical Exam Vitals and nursing note reviewed.  Constitutional:      Appearance: She is well-developed.  HENT:     Head: Normocephalic and atraumatic.  Eyes:      Pupils: Pupils are equal, round, and reactive to light.  Cardiovascular:     Rate and Rhythm: Normal rate and regular rhythm.  Pulmonary:     Effort: Pulmonary effort is normal. No respiratory distress.  Skin:    General: Skin is warm and dry.  Neurological:     Mental Status: She is alert and oriented to person, place, and time.  Psychiatric:        Behavior: Behavior normal.        Thought Content: Thought content normal.        Judgment: Judgment normal.         Assessment/Plan:     24 year old with abnormal uterine bleeding and concern for pregnancy desires new contraception method. Will check beta hcg today- pregnancy test negative in office.  Will start nuvaring Waiting for STD screening results which were recent Will Follow up as needed  More than 25 minutes were spent face to face with the patient in the room, reviewing the medical record, labs and images, and coordinating care for the patient. The plan of management was discussed in detail and counseling was provided.     Adrian Prows MD [facility] 04/12/2020 12:03 PM

## 2020-04-12 NOTE — Progress Notes (Signed)
Pt states she's having some breakthrough bleeding after her period. Pt decline her flu shot.

## 2020-04-13 LAB — BETA HCG QUANT (REF LAB): hCG Quant: <1 m[IU]/mL

## 2020-04-16 ENCOUNTER — Ambulatory Visit: Payer: Medicaid Other | Admitting: Licensed Clinical Social Worker

## 2020-04-30 ENCOUNTER — Encounter: Payer: Self-pay | Admitting: Licensed Clinical Social Worker

## 2020-04-30 ENCOUNTER — Ambulatory Visit: Payer: Medicaid Other | Admitting: Licensed Clinical Social Worker

## 2020-04-30 DIAGNOSIS — F4322 Adjustment disorder with anxiety: Secondary | ICD-10-CM
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
## 2020-05-14 ENCOUNTER — Ambulatory Visit: Payer: Medicaid Other | Admitting: Licensed Clinical Social Worker

## 2020-05-14 DIAGNOSIS — F4322 Adjustment disorder with anxiety: Secondary | ICD-10-CM

## 2020-05-14 NOTE — Progress Notes (Signed)
Counselor/Therapist Progress Note  Patient ID: Brandi Watkins, MRN: [id number],    Date: 05/14/2020  Time Spent: 45 minutes  Treatment Type: Individual Therapy  Reported Symptoms: mild anxiety symptoms   Mental Status Exam:  Appearance:   Casual     Behavior:  Appropriate and Sharing  Motor:  Normal  Speech/Language:   Normal Rate  Affect:  Appropriate and Congruent  Mood:  normal  Thought process:  normal  Thought content:    WNL  Sensory/Perceptual disturbances:    WNL  Orientation:  oriented to person, place and time/date  Attention:  Good  Concentration:  Good  Memory:  WNL  Fund of knowledge:   Good  Insight:    Good  Judgment:   Good  Impulse Control:  Good   Risk Assessment: Danger to Self:  No Self-injurious Behavior: No Danger to Others: No Duty to Warn:no Physical Aggression / Violence:No  Access to Firearms a concern: No  Gang Involvement:No   Subjective: Patient was engaged and cooperative throughout the session using time effectively to discuss thoughts and feelings, and treatment plan. Patient voices continued motivation for treatment and understanding of anxiety issues related to stressful events.  Patient is likely to benefit from future treatment because she is motivated to decrease symptoms and improve functioning.    Interventions: Cognitive Behavioral Therapy Established psychological safety.  Checked in with patient, established session agenda and reviewed previous session, including assessment and goal of treatment. Reviewed CBTs. Explored patient's goal of treatment and worked collaboratively to develop CBT treatment plan. Discussed patient's continued co-parenting challenges and provided information regarding the typical process for child custody/visitation. Provided support through active listening, validation of feelings, and highlighted patient's strengths.  Diagnosis:   ICD-10-CM   1. Adjustment disorder with anxious mood  F43.22     Plan:  Patient's goal of treatment is to know how to do things - guidance, vent- to release emotions, wants to avoid acting out - learn to regulate emotions.   Treatment Target: Understand the relationship between thoughts, emotions, and behaviors  - Psychoeducation on CBT model   - Teach the connection between thoughts, emotions, and behaviors  Treatment Target: Increase realistic balanced thinking  - Explore patient's thoughts, beliefs, automatic thoughts, assumptions  - Identify unhelpful thinking patterns  - Process distress and allow for emotional release  - Cognitive reframing  - Evaluate thoughts - Teaching unhelpful thought patterns  - Modify underlying beliefs  - questioning  - Provide psychoeducation on core beliefs, explore, and assist patient in identifying core beliefs  Treatment Target: Reducing vulnerability to "emotional mind" - Values clarification   - Self-care - nutrition, sleep, exercise  - Increase positive events  o Activity planning  Treatment Target: Increase coping skills for mood regulation  - Mindfulness practices  - Deep breathing  - Grounding techniques as necessary  - STOP technique  Future Appointments  Date Time Provider [place]  05/28/2020 10:30 AM Milton Ferguson, LCSW [facility] None    Interpreter used:  NA   Milton Ferguson, LCSW

## 2020-05-28 ENCOUNTER — Ambulatory Visit: Payer: Medicaid Other | Admitting: Licensed Clinical Social Worker

## 2020-05-28 DIAGNOSIS — F4322 Adjustment disorder with anxiety: Secondary | ICD-10-CM

## 2020-05-28 NOTE — Progress Notes (Signed)
Counselor/Therapist Progress Note  Patient ID: Brandi Watkins, MRN: [id number],    Date: 05/28/2020  Time Spent: 45 minutes   Treatment Type: Individual Therapy  Reported Symptoms: low mood, sadness, crying spells; Anxiety, anxious thoughts  Mental Status Exam:  Appearance:   Casual and Neat     Behavior:  Appropriate and Sharing  Motor:  Normal  Speech/Language:   Normal Rate  Affect:  Appropriate and Congruent  Mood:  euthymic  Thought process:  normal  Thought content:    WNL  Sensory/Perceptual disturbances:    WNL  Orientation:  oriented to person, place, time/date and situation  Attention:  Good  Concentration:  Good  Memory:  WNL  Fund of knowledge:   Good  Insight:    Good  Judgment:   Good  Impulse Control:  Good   Risk Assessment: Danger to Self:  No Self-injurious Behavior: No Danger to Others: No Duty to Warn:no Physical Aggression / Violence:No  Access to Firearms a concern: No  Gang Involvement:No   Subjective: Patient was engaged and cooperative throughout the session using time effectively to discuss thoughts and feelings. Patient voices continued motivation for treatment and understanding of depressive/anxiety symptoms. Patient is likely to benefit from future treatment because she remains motivated to decrease symptoms.     Interventions: Cognitive Behavioral Therapy Established psychological safety. Checked in with patient reviewed previous session regarding challenges with co-parenting, discussing progress made since last session.Reviewed treatment plan. Engaged patient in processing current psychosocial stressors mild depressive/anxiety symptoms due to utilization of social media. Validated patient's thoughts and feelings and discussed patient's plans to limit time on social media. Provided support through active listening, validation of feelings, and highlighted patient's strengths.   Diagnosis:   ICD-10-CM   1. Adjustment disorder with anxious  mood  F43.22     Plan: Patient's goal of treatment isto know how to do things - guidance, vent- to release emotions, wants to avoid acting out - learn to regulate emotions.  Treatment Target: Understand the relationship between thoughts, emotions, and behaviors   Psychoeducation on CBT model    Teach the connection between thoughts, emotions, and behaviors  Treatment Target: Increase realistic balanced thinking   Explore patient's thoughts, beliefs, automatic thoughts, assumptions   Identify unhelpful thinking patterns   Process distress and allow for emotional release   Cognitive reframing   Evaluate thoughts  Teaching unhelpful thought patterns   Modify underlying beliefs   questioning   Provide psychoeducation on core beliefs, explore, and assist patient in identifying core beliefs  Treatment Target: Reducing vulnerability to "emotional mind"  Values clarification    Self-care - nutrition, sleep, exercise   Increase positive events  ? Activity planning  Treatment Target: Increase coping skills for mood regulation   Mindfulness practices   Deep breathing   Grounding techniques as necessary   STOP technique  Future Appointments  Date Time Provider [place]  06/10/2020 10:00 AM Milton Ferguson, LCSW [facility] None   Interpreter used: NA  Milton Ferguson, LCSW

## 2020-06-10 ENCOUNTER — Ambulatory Visit: Payer: Medicaid Other | Admitting: Licensed Clinical Social Worker

## 2020-06-10 DIAGNOSIS — F4322 Adjustment disorder with anxiety: Secondary | ICD-10-CM

## 2020-06-10 NOTE — Progress Notes (Signed)
Counselor/Therapist Progress Note  Patient ID: Brandi Watkins, MRN: [id number],    Date: 06/10/2020  Time Spent: 45 minutes  Treatment Type: Individual Therapy  Reported Symptoms: overall mood stability over the past two weeks  Mental Status Exam:  Appearance:   Casual and Neat     Behavior:  Appropriate and Sharing  Motor:  Normal  Speech/Language:   Normal Rate  Affect:  Appropriate and Congruent  Mood:  normal  Thought process:  normal  Thought content:    WNL  Sensory/Perceptual disturbances:    WNL  Orientation:  oriented to person, place, time/date and situation  Attention:  Good  Concentration:  Good  Memory:  WNL  Fund of knowledge:   Good  Insight:    Good  Judgment:   Good  Impulse Control:  Good   Risk Assessment: Danger to Self:  No Self-injurious Behavior: No Danger to Others: No Duty to Warn:no Physical Aggression / Violence:No  Access to Firearms a concern: No  Gang Involvement:No   Subjective: Patient was engaged and cooperative throughout the session using time effectively to discuss thoughts and feelings. Patient voices continued motivation for treatment and understanding of mood and anxiety issues. Patient is likely to benefit from future treatment because she remains motivated to decrease symptoms and improve functioning.  Interventions: Cognitive Behavioral Therapy Established psychological safety. Checked in with patient regarding her week. Provided supportive space encouraging emotional release and processing of current psychosocial stressors, overall mood stability; challenges in relationship with father of baby. Validated patient's feelings or worry/concern. Discussed options to increase communication with FOB.  Provided support through active listening, validation of feelings, and highlighted patient's strengths.   Diagnosis:   ICD-10-CM   1. Adjustment disorder with anxious mood  F43.22    Plan: Patient's goal of treatment isto know how to  do things - guidance, vent- to release emotions, wants to avoid acting out - learn to regulate emotions.  Treatment Target: Understand the relationship between thoughts, emotions, and behaviors   Psychoeducation on CBT model   Teach the connection between thoughts, emotions, and behaviors  Treatment Target: Increase realistic balanced thinking   Explore patient's thoughts, beliefs, automatic thoughts, assumptions   Identify unhelpful thinking patterns   Process distress and allow for emotional release   Cognitive reframing   Evaluate thoughts  Teaching unhelpful thought patterns   Modify underlying beliefs   questioning   Provide psychoeducation on core beliefs, explore, and assist patient in identifying core beliefs  Treatment Target: Reducing vulnerability to "emotional mind"  Values clarification   Self-care -nutrition, sleep, exercise   Increase positive events  ? Activity planning  Treatment Target: Increase coping skillsfor mood regulation  Mindfulness practices   Deep breathing   Grounding techniques as necessary   STOP technique  Future Appointments  Date Time Provider [place]  06/24/2020 10:00 AM Milton Ferguson, LCSW [facility] None    Interpreter used: NA  Milton Ferguson, LCSW

## 2020-06-24 ENCOUNTER — Ambulatory Visit: Payer: Medicaid Other | Admitting: Licensed Clinical Social Worker

## 2020-06-24 DIAGNOSIS — F4322 Adjustment disorder with anxiety: Secondary | ICD-10-CM

## 2020-06-24 NOTE — Progress Notes (Signed)
Counselor/Therapist Progress Note  Patient ID: Brandi Watkins, MRN: [id number],    Date: 06/24/2020  Time Spent: 45 minutes   Treatment Type: Individual Therapy  Reported Symptoms: mild anxiety, overall mood stability  Mental Status Exam:  Appearance:   Casual     Behavior:  Appropriate and Sharing  Motor:  Normal  Speech/Language:   Normal Rate  Affect:  Appropriate and Congruent  Mood:  normal  Thought process:  normal  Thought content:    WNL  Sensory/Perceptual disturbances:    WNL  Orientation:  oriented to person, place, time/date and situation  Attention:  Good  Concentration:  Good  Memory:  WNL  Fund of knowledge:   Good  Insight:    Good  Judgment:   Good  Impulse Control:  Good   Risk Assessment: Danger to Self:  No Self-injurious Behavior: No Danger to Others: No Duty to Warn:no Physical Aggression / Violence:No  Access to Firearms a concern: No  Gang Involvement:No   Subjective: Patient was engaged and cooperative throughout the session using time effectively to discuss thoughts and feelings. Patient voices continued motivation for treatment and understanding of depressive and anxiety symptoms. Patient is likely to benefit from future treatment because she remains motivated to decrease symptoms and improve functioning and reports benefit of regular sessions in addressing symptoms.   Interventions: Cognitive Behavioral Therapy Established psychological safety. Checked in with patient regarding her week. Engaged patient in processing current psychosocial stressors. Discussed coping strategies used by patient, highlighting thought distracting, and engaging with positive supports and encouraged patient to continue these practices. Provided Psychoeducation on mindfulness, engaged patient in mindfulness exercise, processed exercise, and contracted with patient to complete daily. Provided support through active listening, validation of feelings, and highlighted  patient's strengths.   Diagnosis:   ICD-10-CM   1. Adjustment disorder with anxious mood  F43.22     Plan: Patient's goal of treatment isto know how to do things - guidance, vent- to release emotions, wants to avoid acting out - learn to regulate emotions.  Treatment Target: Understand the relationship between thoughts, emotions, and behaviors   Psychoeducation on CBT model   Teach the connection between thoughts, emotions, and behaviors  Treatment Target: Increase realistic balanced thinking   Explore patient's thoughts, beliefs, automatic thoughts, assumptions   Identify unhelpful thinking patterns   Process distress and allow for emotional release   Cognitive reframing   Evaluate thoughts  Teaching unhelpful thought patterns   Modify underlying beliefs   questioning   Provide psychoeducation on core beliefs, explore, and assist patient in identifying core beliefs  Treatment Target: Reducing vulnerability to "emotional mind"  Values clarification   Self-care -nutrition, sleep, exercise   Increase positive events  ? Activity planning  Treatment Target: Increase coping skillsfor mood regulation  Mindfulness practices   Deep breathing   Grounding techniques as necessary   STOP technique  Future Appointments  Date Time Provider [place]  07/15/2020 10:40 AM Milton Ferguson, LCSW [facility] None    Interpreter used: NA  Milton Ferguson, LCSW

## 2020-07-15 ENCOUNTER — Ambulatory Visit: Payer: Medicaid Other | Admitting: Licensed Clinical Social Worker

## 2020-07-15 DIAGNOSIS — F4322 Adjustment disorder with anxiety: Secondary | ICD-10-CM

## 2020-07-15 NOTE — Progress Notes (Signed)
Counselor/Therapist Progress Note  Patient ID: LYRIC ROSSANO, MRN: [id number],    Date: 07/15/2020  Time Spent: 27 minutes    Treatment Type: Individual Therapy  Reported Symptoms: mild anxiety, anxious thoughts, stress  Mental Status Exam:  Appearance:   Casual, Neat and Well Groomed     Behavior:  Appropriate and Sharing  Motor:  Normal  Speech/Language:   Normal Rate  Affect:  Appropriate and Congruent  Mood:  normal  Thought process:  normal  Thought content:    WNL  Sensory/Perceptual disturbances:    WNL  Orientation:  oriented to person, place, time/date, situation and day of week  Attention:  Good  Concentration:  Good  Memory:  WNL  Fund of knowledge:   Good  Insight:    Good  Judgment:   Good  Impulse Control:  Good   Risk Assessment: Danger to Self:  No Self-injurious Behavior: No Danger to Others: No Duty to Warn:no Physical Aggression / Violence:No  Access to Firearms a concern: No  Gang Involvement:No   Subjective: Patient was engaged and cooperative throughout the session using time effectively to discuss thoughts and feelings. Patient voices she has met her goal of treatment and is in agreement with closing out services at this time. Patient is encouraged to seek services as needed in the future.   Interventions: Cognitive Behavioral Therapy Established psychological safety.Checked in with patient regarding her week. Reviewed previous session regarding breathing exercises. Discussed termination of services. Reviewed stratiggies for relapse prevention, including cognitive techniques, self-care, engaging in positive peer and family relationships. Therapist encouraged patient to return as needed in the future. Provided support through active listening, validation of feelings, and highlighted patient's strengths.   Diagnosis:   ICD-10-CM   1. Adjustment disorder with anxious mood  F43.22     Plan: Patient to return for services, as needed.   No future  appointments.  Milton Ferguson, LCSW

## 2020-07-24 IMAGING — US US MFM OB DETAIL+14 WK
1 series · 12 of 28 positions shown · non-contrast
Comparison: none

PATIENT INFO:

PERFORMED BY:
                   Sonographer                              SADIKE
SERVICE(S) PROVIDED:
 ----------------------------------------------------------------------
INDICATIONS:
  26 weeks gestation of pregnancy
FETAL EVALUATION:
 Num Of Fetuses:         1
 Fetal Heart Rate(bpm):  144
 Cardiac Activity:       Present
 Presentation:           Breech
 Placenta:               Posterior right, fundal
 P. Cord Insertion:      Marginal
 AFI Sum(cm)     %Tile       Largest Pocket(cm)
 13.5            39
 RUQ(cm)       RLQ(cm)       LUQ(cm)        LLQ(cm)
BIOMETRY:
 BPD:      66.7  mm     G. Age:  26w 6d         71  %    CI:        75.95   %    70 - 86
                                                         FL/HC:      18.7   %    18.6 -
 HC:      242.6  mm     G. Age:  26w 3d         37  %    HC/AC:      1.15        1.04 -
 AC:      211.4  mm     G. Age:  25w 5d         31  %    FL/BPD:     68.1   %    71 - 87
 FL:       45.4  mm     G. Age:  25w 0d         13  %    FL/AC:      21.5   %    20 - 24
 HUM:        42  mm     G. Age:  25w 2d         28  %
 CM:        6.3  mm
 Est. FW:     825  gm    1 lb 13 oz      27  %
GESTATIONAL AGE:
 LMP:           27w 1d        Date:  10/12/18                 EDD:   07/19/19
 U/S Today:     26w 0d                                        EDD:   07/27/19
 Best:          26w 0d     Det. By:  Early Ultrasound         EDD:   07/27/19
                                     (12/06/18)
ANATOMY:
 Cranium:               Within Normal Limits   Aortic Arch:            Normal appearance
 Cavum:                 CSP visualized         Ductal Arch:            Normal appearance
 Ventricles:            Normal appearance      Diaphragm:              Within Normal Limits
 Choroid Plexus:        Within Normal Limits   Stomach:                Seen
 Cerebellum:            Within Normal Limits   Abdomen:                Within Normal
                                                                       Limits
 Posterior Fossa:       Within Normal Limits   Abdominal Wall:         Normal appearance
 Nuchal Fold:           Within Normal Limits   Cord Vessels:           3 vessels
 Face:                  Orbits visualized      Kidneys:                Normal appearance
 Lips:                  Normal appearance      Bladder:                Seen
 Thoracic:              Within Normal Limits   Spine:                  Normal appearance
 Heart:                 4-Chamber view         Upper Extremities:      Visualized
                        appears normal
 RVOT:                  Normal appearance      Lower Extremities:      Visualized
 LVOT:                  Normal appearance
DOPPLER - FETAL VESSELS:
 Umbilical Artery
  S/D     %tile
 3.12       51

[Series 1: us mfm ob detail+14 wk · 0.25mm/px · 12 of 119 slices shown]
[im 5/119]
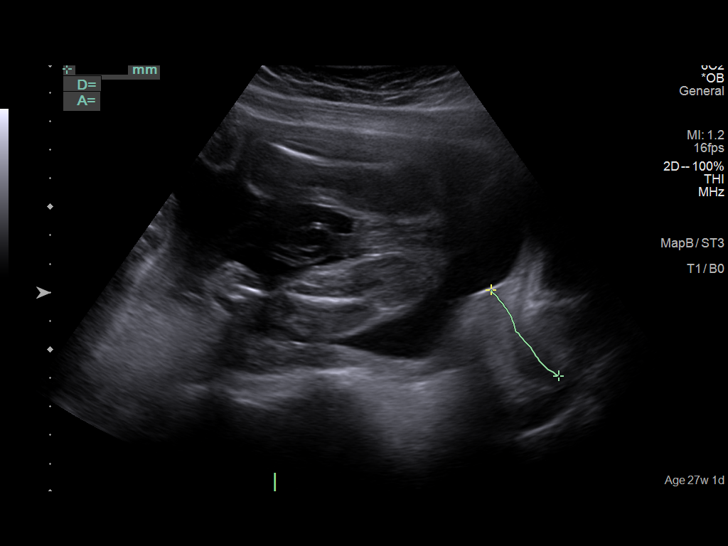
[im 14/119]
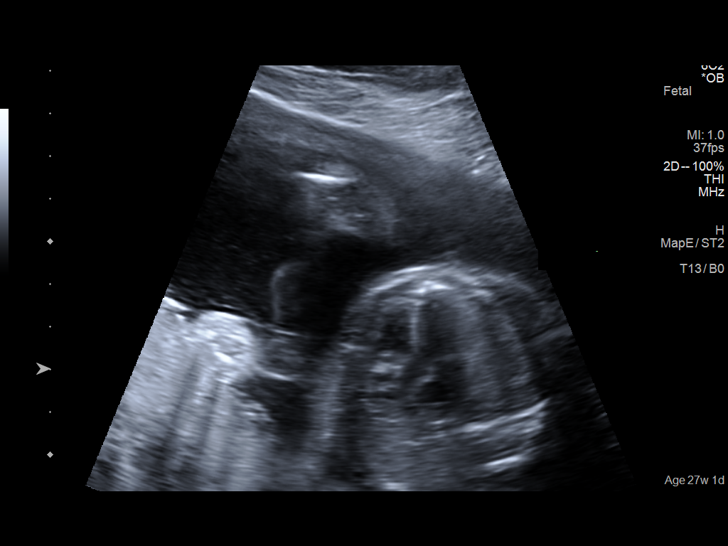
[im 22/119]
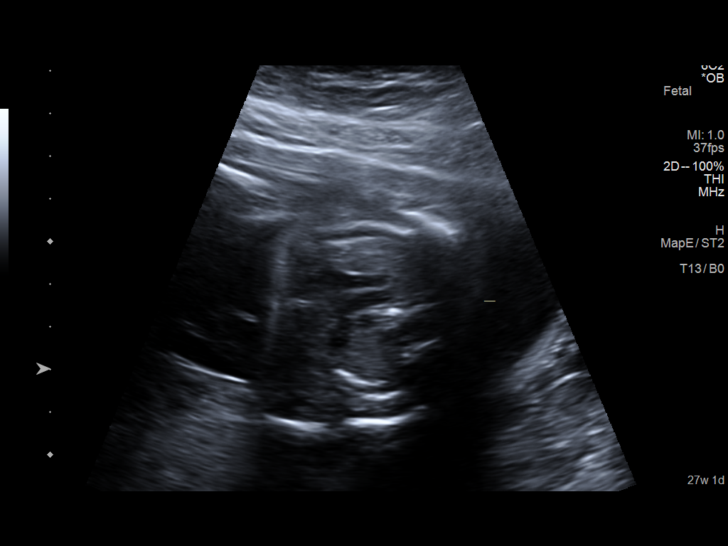
[im 35/119]
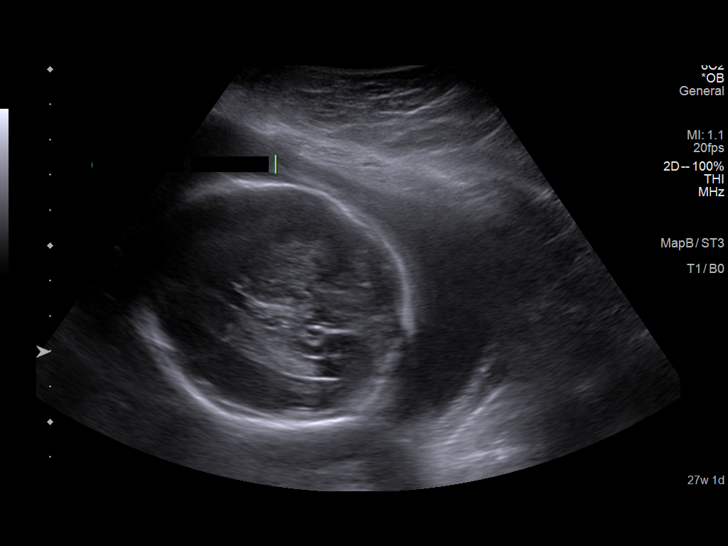
[im 44/119]
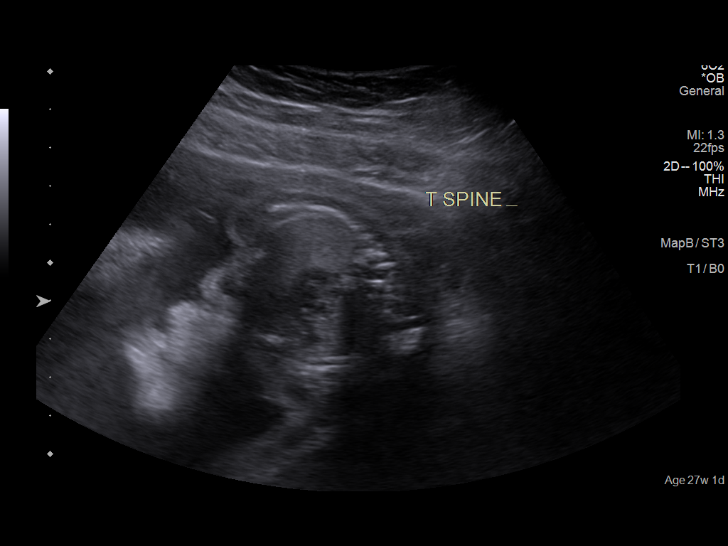
[im 53/119]
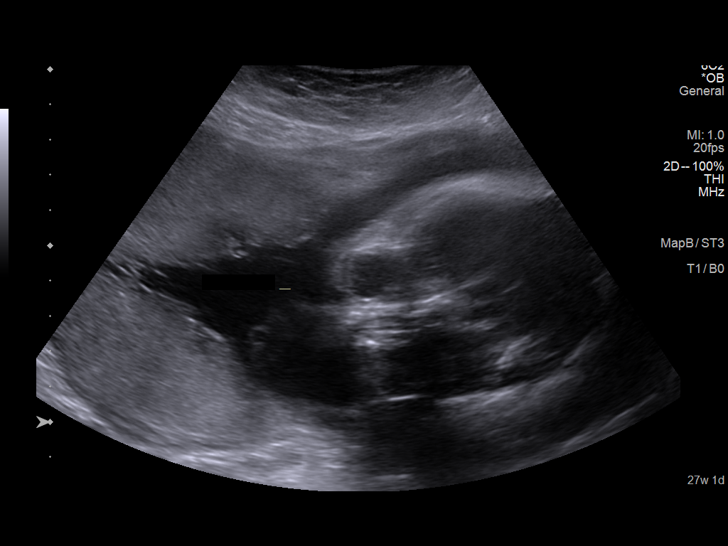
[im 66/119]
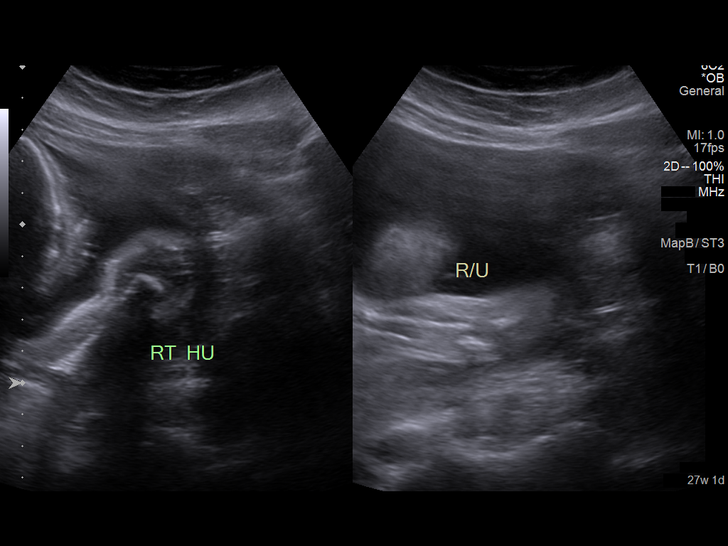
[im 75/119]
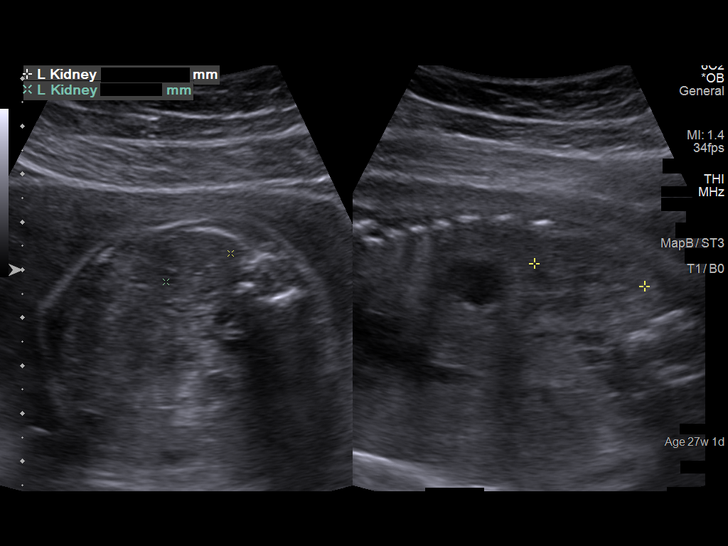
[im 84/119]
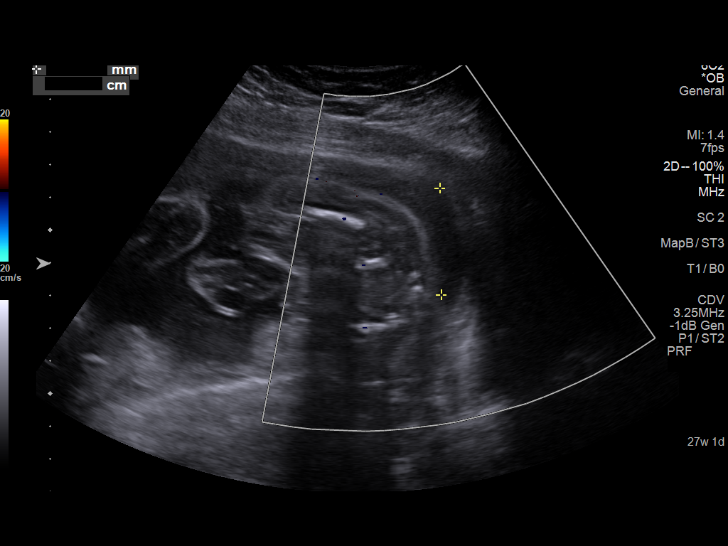
[im 97/119]
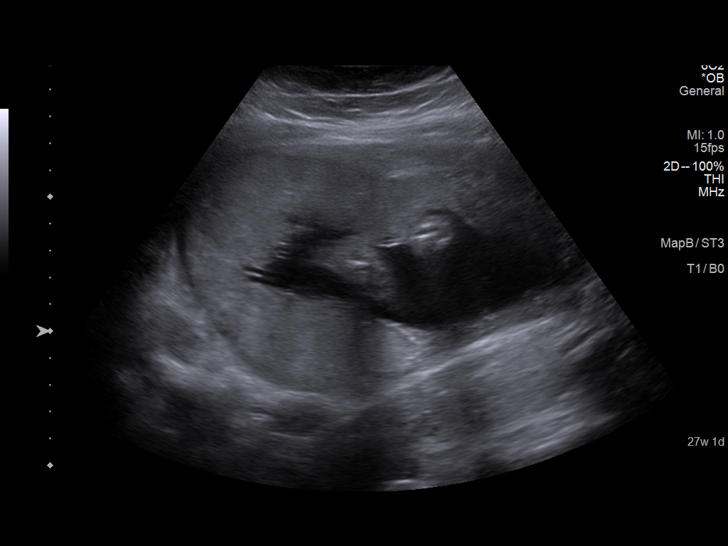
[im 105/119]
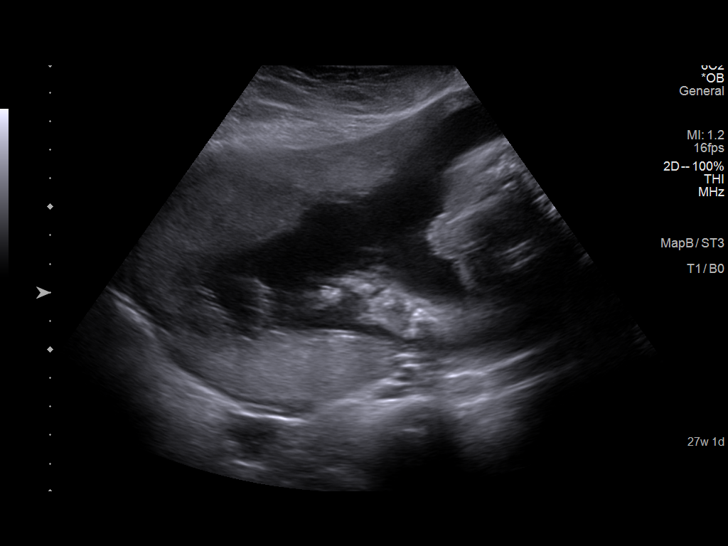
[im 114/119]
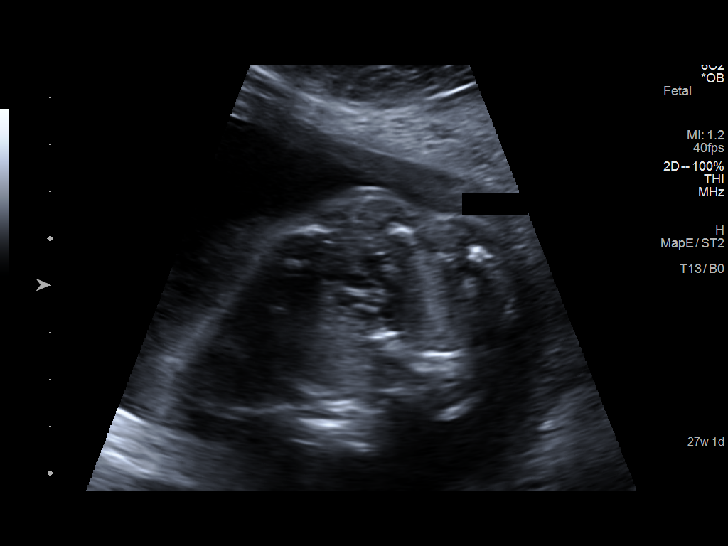

[12 of 28 positions shown; findings below may reference images not displayed]

IMPRESSION: Dear Colleagues

 Thank you for referring your patient for detailed anatomic
 survey and fetal growth assessment due to suspected early
 onset FGR and marginal cord insertion.

 Dates were changed today following review of her first
 ultrasound images and report from  [facility].  Using the
 CRL only (not the average of CRL and gestational sac) her
 Gestaional age would be 26 weeks 0 days today.  She is
 therefore, dated by the earliest available ultrasound
 performed at [facility] on 12/06/18; CRL measurement was
 consistent with 6 weeks 5 days and her EDD is 07/26/18.

 There is a singleton gestation with subjectively normal
 amniotic fluid volume.

 The fetal biometry correlates with established dating.

 Detailed evaluation of the fetal anatomy was performed.
 The fetal anatomical survey appears within normal limits
 within the resolution of ultrasound as described above.

 A marginal placental cord insertion is identified.

 Recommend follow up growth in 6 weeks. We have
 scheduled here; please cancel  that appoitment if more
 convenient to the patient to perform in your office.

 Thank you for allowing us to participate in your patient's care.
 assistance.

                  [place], [place]

## 2020-08-01 ENCOUNTER — Encounter: Payer: Self-pay | Admitting: Advanced Practice Midwife

## 2020-08-01 ENCOUNTER — Ambulatory Visit: Payer: Medicaid Other | Admitting: Advanced Practice Midwife

## 2020-08-01 ENCOUNTER — Other Ambulatory Visit: Payer: Self-pay

## 2020-08-01 DIAGNOSIS — Z113 Encounter for screening for infections with a predominantly sexual mode of transmission: Secondary | ICD-10-CM

## 2020-08-01 LAB — WET PREP FOR TRICH, YEAST, CLUE
Trichomonas Exam: NEGATIVE
Yeast Exam: NEGATIVE

## 2020-08-01 LAB — PREGNANCY, URINE: Preg Test, Ur: NEGATIVE

## 2020-08-01 NOTE — Progress Notes (Signed)
PT negative, wet mount reviewed, no treatment indicated. Patient counseled that she can make appointment to discuss [facility] if she would like to and patient states she has BC to pick up at the pharmacy and plans to start using. Patient counseled to call with questions if needed.Jenetta Downer, RN
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
## 2020-08-06 LAB — GONOCOCCUS CULTURE

## 2020-08-15 ENCOUNTER — Encounter: Payer: Self-pay | Admitting: Physician Assistant

## 2020-12-10 ENCOUNTER — Encounter: Payer: Self-pay | Admitting: Physician Assistant

## 2020-12-10 ENCOUNTER — Ambulatory Visit: Payer: Medicaid Other | Admitting: Physician Assistant

## 2020-12-10 ENCOUNTER — Other Ambulatory Visit: Payer: Self-pay

## 2020-12-10 DIAGNOSIS — Z113 Encounter for screening for infections with a predominantly sexual mode of transmission: Secondary | ICD-10-CM | POA: Diagnosis not present

## 2020-12-10 LAB — WET PREP FOR TRICH, YEAST, CLUE
Trichomonas Exam: NEGATIVE
Yeast Exam: NEGATIVE

## 2020-12-10 LAB — PREGNANCY, URINE: Preg Test, Ur: NEGATIVE
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
## 2020-12-14 LAB — GONOCOCCUS CULTURE

## 2020-12-16 LAB — HM HIV SCREENING LAB: HM HIV Screening: NEGATIVE

## 2020-12-17 ENCOUNTER — Ambulatory Visit: Payer: Medicaid Other

## 2020-12-17 ENCOUNTER — Other Ambulatory Visit: Payer: Self-pay

## 2020-12-17 ENCOUNTER — Telehealth: Payer: Self-pay

## 2020-12-17 DIAGNOSIS — A749 Chlamydial infection, unspecified: Secondary | ICD-10-CM

## 2020-12-17 MED ORDER — AZITHROMYCIN 500 MG PO TABS
1000.0000 mg | ORAL_TABLET | Freq: Once | ORAL | Status: AC
  Administered 2020-12-17: 1000 mg via ORAL

## 2020-12-17 NOTE — Telephone Encounter (Signed)
Password verified Informed of positive chlamydia results and need for treatment. Appt schedule for today, arrive by 1:20pm

## 2020-12-17 NOTE — Progress Notes (Signed)
In Nurse Clinic for chlamydia treatment. Reports last sex 2 weeks ago and using no birth control method. Currently breastfeeding. Treated today with Azithromycin 1 gram po DOT per SO Dr. Raliegh Ip. [place]. Advised to contact [facility] if vomits within 2 hrs of taking med. Questions answered and reports understanding. Josie Saunders, RN

## 2020-12-17 NOTE — Telephone Encounter (Signed)
Needs appointment for positive Chlamydia from 12/10/2020 visit.  Left message to return call.

## 2021-03-10 ENCOUNTER — Ambulatory Visit: Payer: Medicaid Other

## 2021-03-11 ENCOUNTER — Telehealth: Payer: Self-pay

## 2021-03-11 ENCOUNTER — Other Ambulatory Visit: Payer: Self-pay

## 2021-03-11 ENCOUNTER — Ambulatory Visit: Payer: Medicaid Other | Admitting: Family Medicine

## 2021-03-11 ENCOUNTER — Encounter: Payer: Self-pay | Admitting: Family Medicine

## 2021-03-11 DIAGNOSIS — Z113 Encounter for screening for infections with a predominantly sexual mode of transmission: Secondary | ICD-10-CM | POA: Diagnosis not present

## 2021-03-11 DIAGNOSIS — Z3200 Encounter for pregnancy test, result unknown: Secondary | ICD-10-CM

## 2021-03-11 DIAGNOSIS — N946 Dysmenorrhea, unspecified: Secondary | ICD-10-CM

## 2021-03-11 LAB — PREGNANCY, URINE: Preg Test, Ur: NEGATIVE

## 2021-03-11 LAB — WET PREP FOR TRICH, YEAST, CLUE
Trichomonas Exam: NEGATIVE
Yeast Exam: NEGATIVE

## 2021-03-11 NOTE — Progress Notes (Signed)
Here today for STI screening today. Declines bloodwork. "States I think I'm having a miscarriage." States she has had heavy vaginal bleeding since starting last period on 03/06/2021. Hal Morales, RN

## 2021-03-11 NOTE — Telephone Encounter (Signed)
Patient is calling with complaint of break thru bleeding. First day very heavy has lighten up currently on day 3. No home pregnancy test taken. No current opening with you for two weeks. Please advise
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
## 2021-03-12 NOTE — Telephone Encounter (Signed)
Patient is scheduled for 03/27/21 at 8:10 with [facility]

## 2021-03-21 ENCOUNTER — Telehealth: Payer: Self-pay | Admitting: Family Medicine

## 2021-03-27 ENCOUNTER — Encounter: Payer: Self-pay | Admitting: Obstetrics and Gynecology

## 2021-03-27 ENCOUNTER — Ambulatory Visit (INDEPENDENT_AMBULATORY_CARE_PROVIDER_SITE_OTHER): Payer: Medicaid Other | Admitting: Obstetrics and Gynecology

## 2021-03-27 ENCOUNTER — Other Ambulatory Visit: Payer: Self-pay

## 2021-03-27 VITALS — BP 118/72 | Ht 62.0 in | Wt 171.6 lb

## 2021-03-27 DIAGNOSIS — Z3009 Encounter for other general counseling and advice on contraception: Secondary | ICD-10-CM

## 2021-03-27 DIAGNOSIS — Z23 Encounter for immunization: Secondary | ICD-10-CM

## 2021-03-27 MED ORDER — NORETHIN ACE-ETH ESTRAD-FE 1-20 MG-MCG PO TABS: 1.0000 | ORAL_TABLET | Freq: Every day | ORAL | 11 refills | Status: AC

## 2021-03-27 NOTE — Progress Notes (Signed)
Patient ID: Brandi Watkins, female   DOB: 10/17/1995, 25 y.o.   MRN: [id number]  Reason for Consult: Gynecologic Exam   Referred by No ref. provider found  Subjective:     HPI:  Brandi Watkins is a 25 y.o. female she presents today for concerns regarding possible miscarriage.  She reports that she had a menstrual cycle at the beginning of August and then again on August 25.  She was concerned because the bleeding on August 25 lasted slightly longer 5 to 7 days versus her normal 3 to 5 days.  She reports that during that menstrual cycle she passed a small amount of tissue and she is concerned that maybe she was having a miscarriage and did not know about it.  She was seen shortly after that through the health department and had a negative pregnancy test.  She would like to know today if she would was pregnant in August.  I had seen the patient last year and prescribed her NuvaRing although she reports that she never started that birth control.  She reports that she is currently interested in starting birth control as she does not desire to become pregnant in the next year.  She was interested in the patch but we discussed that since her BMI is 31 today the patch is not recommended.  She had recent STD testing through the health department which was normal.  Gynecological History  Patient's last menstrual period was 03/06/2021. Menarche: 13 She reports heavy bleeding during her menstrual cycle.  She reports that she sometimes passes clots and has flooding or gushing of blood.  She has not changed her saturate a pad more frequently than every hour.  She sometimes has accidents where she bleeds through her clothes.  She does not have severe pain during her menstrual cycle.  History of fibroids, polyps, or ovarian cysts? :  No History of PCOS? no Hstory of Endometriosis?  No History of abnormal pap smears? yes Have you had any sexually transmitted infections in the past? Yes, history of  trichomonas  She denies HPV vaccination in the past.  She would like to start HPV vaccination today and received her first injection.  Last Pap: Results were: 2021 NIL    She identifies as a female. She is sexually active with men.   She sometimes has dyspareunia. She denies postcoital bleeding.  She currently uses none for contraception.   Obstetrical History OB History  Gravida Para Term Preterm AB Living  '2 1 1 '$ 0 1 1  SAB IAB Ectopic Multiple Live Births  1 0 0 0 1    # Outcome Date GA Lbr Len/2nd Weight Sex Delivery Anes PTL Lv  2 Term 07/12/19 [redacted]w[redacted]d 5 lb 10.3 oz (2.56 kg) M CS-LTranv Spinal  LIV  1 SAB 2020             Past Medical History:  Diagnosis Date   ADHD (attention deficit hyperactivity disorder)    Anemia    Chlamydia contact, treated 11/2016   Preeclampsia, third trimester 07/10/2019   Family History  Problem Relation Age of Onset   Diabetes Mother    Hypertension Mother    Asthma Maternal Aunt    Hypertension Maternal Grandmother    Past Surgical History:  Procedure Laterality Date   CESAREAN SECTION N/A 07/12/2019   Procedure: CESAREAN SECTION;  Surgeon: JWill Bonnet MD;  Location: [facility] ORS;  Service: Obstetrics;  Laterality: N/A;   NONE  Short Social History:  Social History   Tobacco Use   Smoking status: Former    Types: Cigarettes, Cigars   Smokeless tobacco: Never  Substance Use Topics   Alcohol use: Yes    Alcohol/week: 1.0 standard drink    Types: 1 Standard drinks or equivalent per week    Comment: 2x/mo    No Known Allergies  Current Outpatient Medications  Medication Sig Dispense Refill   etonogestrel-ethinyl estradiol (NUVARING) 0.12-0.015 MG/24HR vaginal ring Insert vaginally and leave in place for 3 consecutive weeks, then remove for 1 week. (Patient not taking: Reported on 12/10/2020) 1 each 11   norethindrone (MICRONOR) 0.35 MG tablet Take 1 tablet (0.35 mg total) by mouth daily. (Patient not taking: Reported  on 04/03/2020) 3 Package 4   Prenatal Vit-Fe Fumarate-FA (MULTIVITAMIN-PRENATAL) 27-0.8 MG TABS tablet Take 1 tablet by mouth daily at 12 noon. (Patient not taking: Reported on 01/16/2020)     No current facility-administered medications for this visit.    Review of Systems  Constitutional: Negative for chills, fatigue, fever and unexpected weight change.  HENT: Negative for trouble swallowing.  Eyes: Negative for loss of vision.  Respiratory: Negative for cough, shortness of breath and wheezing.  Cardiovascular: Negative for chest pain, leg swelling, palpitations and syncope.  GI: Negative for abdominal pain, blood in stool, diarrhea, nausea and vomiting.  GU: Negative for difficulty urinating, dysuria, frequency and hematuria.  Musculoskeletal: Negative for back pain, leg pain and joint pain.  Skin: Negative for rash.  Neurological: Negative for dizziness, headaches, light-headedness, numbness and seizures.  Psychiatric: Negative for behavioral problem, confusion, depressed mood and sleep disturbance.       Objective:  Objective   Vitals:   03/27/21 0815  BP: 118/72  Weight: 171 lb 9.6 oz (77.8 kg)  Height: '5\' 2"'$  (1.575 m)   Body mass index is 31.39 kg/m.  Physical Exam Vitals and nursing note reviewed. Exam conducted with a chaperone present.  Constitutional:      Appearance: Normal appearance.  HENT:     Head: Normocephalic and atraumatic.  Eyes:     Extraocular Movements: Extraocular movements intact.     Pupils: Pupils are equal, round, and reactive to light.  Cardiovascular:     Rate and Rhythm: Normal rate.  Pulmonary:     Effort: Pulmonary effort is normal.     Breath sounds: Normal breath sounds.  Abdominal:     General: Abdomen is flat.     Palpations: Abdomen is soft.  Musculoskeletal:     Cervical back: Normal range of motion.  Skin:    General: Skin is warm and dry.  Neurological:     General: No focal deficit present.     Mental Status: She is alert  and oriented to person, place, and time.  Psychiatric:        Behavior: Behavior normal.        Thought Content: Thought content normal.        Judgment: Judgment normal.    Assessment/Plan:     25 year old who would like to initiate birth control Discussed contraception options with the patient.  She would like to start an oral contraceptive pill.  Prescription for the pill was sent to her pharmacy.  She was provided with instructions regarding initiation of oral contraception.  Discussed that since it has been 3 weeks since her bleeding event it would be difficult to determine if she had had a miscarriage in August.  Discussed that since she had a  negative pregnancy test at the time that the bleeding was occurring it is unlikely that she had a miscarriage but not certain.  Patient was provided with HPV vaccination today.  Discussed that she will need to follow-up in 2 months and then 6 months to complete her vaccination series.  More than 20 minutes were spent face to face with the patient in the room, reviewing the medical record, labs and images, and coordinating care for the patient. The plan of management was discussed in detail and counseling was provided.       Adrian Prows MD [facility] 03/27/2021 8:28 AM

## 2021-03-27 NOTE — Patient Instructions (Signed)
Oral Contraception Information Oral contraceptive pills (OCPs) are medicines taken by mouth to prevent pregnancy. They work by: Preventing the ovaries from releasing eggs. Thickening mucus in the lower part of the uterus (cervix). This prevents sperm from entering the uterus. Thinning the lining of the uterus (endometrium). This prevents a fertilized egg from attaching to the endometrium. OCPs are highly effective when taken exactly as prescribed. However, OCPs do not prevent STIs (sexually transmitted infections). Using condoms while on an OCP can help prevent STIs. What happens before starting OCPs? Before you start taking OCPs: You may have a physical exam, blood test, and Pap test. Your health care provider will make sure you are a good candidate for oral contraception. OCPs are not a good option for certain women, such as: Women who smoke and are older than age 35. Women who have or have had certain conditions, such as: A history of high blood pressure. Deep vein thrombosis. Pulmonary embolism. Stroke. Cardiovascular disease. Peripheral vascular disease. Ask your health care provider about the possible side effects of the OCP you may be prescribed. Be aware that it can take 2-3 months for your body to adjustto changes in hormone levels. Types of oral contraception  Birth control pills contain the hormones estrogen and progestin (synthetic progesterone) or progestin only. The combination pill This type of pill contains estrogen and progestin hormones. Conventional contraception pills come in packs of 21 or 28 pills. Some packs with 28-day pills contain estrogen and progestin for the first 21-24 days. Hormone-free tablets, called placebos, are taken for the final 4-7 days. You should have menstrual bleeding during the time you take the placebos. In packs with 21 tablets, you take no pills for 7 days. Menstrual bleeding occurs during these days. (Some people prefer taking a pill for 28  days to help establish a routine). Extended-interval contraception pills come in packs of 91 pills. The first 84 tablets have both estrogen and progestin. The last 7 pills are placebos. Menstrual bleeding occurs during the placebo days. With this schedule, menstrual bleeding happens once every 3 months. Continuous contraception pills come in packs of 28 pills. All pills in the pack contain estrogen and progestin. With this schedule, regular menstrual bleeding does not happen, but there may be spotting or irregular bleeding. Progestin-only pills This type of pill is often called the mini-pill and contains the progestin hormone only. It comes in packs of 28 pills. In some packs, the last 4 pills are placebos. The pill must be taken at the same time every day. This is very important to prevent pregnancy. Menstrual bleeding may not be regular orpredictable. What are the advantages? Oral contraception provides reliable and continuous contraception if taken as directed. It may treat or decrease symptoms of: Menstrual period cramps. Irregular menstrual cycle or bleeding. Heavy menstrual flow. Abnormal uterine bleeding. Acne, depending on the type of pill. Polycystic ovarian syndrome (POS). Endometriosis. Iron deficiency anemia. Premenstrual symptoms, including severe irritability, depression, or anxiety. It also may: Reduce the risk of endometrial and ovarian cancer. Be used as emergency contraception. Prevent ectopic pregnancies and infections of the fallopian tubes. What can make OCPs less effective? OCPs may be less effective if: You forget to take the pill every day. For progestin-only pills, it is especially important to take the pill at the same time each day. Even taking it 3 hours late can increase the risk of pregnancy. You have a stomach or intestinal disease that reduces your body's ability to absorb the pill. You take   OCPs with other medicines that make OCPs less effective, such as  antibiotics, certain HIV medicines, and some seizure medicines. You take expired OCPs. You forget to restart the pill after 7 days of not taking it. This refers to the packs of 21 pills. What are the side effects and risks? OCPs can sometimes cause side effects, such as: Headache. Depression. Trouble sleeping. Nausea and vomiting. Breast tenderness. Irregular bleeding or spotting during the first several months. Bloating or fluid retention. Increase in blood pressure. Combination pills may slightly increase the risk of: Blood clots. Heart attack. Stroke. Follow these instructions at home: Follow instructions from your health care provider about how to start taking your first cycle of OCPs. Depending on when you start the pill, you may need to use a backup form of birth control, such as condoms, during the first week.Make sure you know what steps to take if you forget to take the pill. Summary Oral contraceptive pills (OCPs) are medicines taken by mouth to prevent pregnancy. They are highly effective when taken exactly as prescribed. OCPs contain a combination of the hormones estrogen and progestin (synthetic progesterone) or progestin only. Before you start taking the pill, you may have a physical exam, blood test, and Pap test. Your health care provider will make sure you are a good candidate for oral contraception. The combination pill may come in a 21-day pack, a 28-day pack, or a 91-day pack. Progestin-only pills come in packs of 28 pills. OCPs can sometimes cause side effects, such as headache, nausea, breast tenderness, or irregular bleeding. This information is not intended to replace advice given to you by your health care provider. Make sure you discuss any questions you have with your healthcare provider. Document Revised: 03/29/2020 Document Reviewed: 03/07/2020 Elsevier Patient Education  2022 [employer]  

## 2021-06-10 ENCOUNTER — Ambulatory Visit: Payer: Medicaid Other

## 2021-11-06 ENCOUNTER — Ambulatory Visit: Payer: Medicaid Other | Admitting: Licensed Practical Nurse

## 2021-11-18 ENCOUNTER — Encounter: Payer: Self-pay | Admitting: Obstetrics

## 2021-11-18 ENCOUNTER — Ambulatory Visit (INDEPENDENT_AMBULATORY_CARE_PROVIDER_SITE_OTHER): Payer: Medicaid Other | Admitting: Obstetrics

## 2021-11-18 ENCOUNTER — Ambulatory Visit: Payer: Medicaid Other | Admitting: Family Medicine

## 2021-11-18 VITALS — BP 120/80 | Ht 63.0 in | Wt 176.0 lb

## 2021-11-18 DIAGNOSIS — Z3049 Encounter for surveillance of other contraceptives: Secondary | ICD-10-CM

## 2021-11-18 DIAGNOSIS — Z3009 Encounter for other general counseling and advice on contraception: Secondary | ICD-10-CM | POA: Diagnosis not present

## 2021-11-18 DIAGNOSIS — Z23 Encounter for immunization: Secondary | ICD-10-CM | POA: Diagnosis not present

## 2021-11-18 DIAGNOSIS — R87612 Low grade squamous intraepithelial lesion on cytologic smear of cervix (LGSIL): Secondary | ICD-10-CM | POA: Diagnosis not present

## 2021-11-18 LAB — POCT URINE PREGNANCY: Preg Test, Ur: NEGATIVE

## 2021-11-18 MED ORDER — PHEXXI 1.8-1-0.4 % VA GEL: 1.0000 | VAGINAL | 3 refills | Status: AC | PRN

## 2021-11-18 NOTE — Progress Notes (Signed)
Chief Complaint  ?Patient presents with  ? Contraception  ?  Pt want the nuvaring   ? ?Patient Brandi Watkins is an 26 y.o. year old [id number] Patient's last menstrual period was 11/07/2021. currently condoms for contraception who presents for desire for birth control. She is interested in the nuvaring. She has used successfully before. She is breastfeeding her 26 yr old. She also wants to get her next gardisil, last completed in 9/22. She is sexually active with no problems. Last encounter was unprotected yesterday and she did not use plan B.  ?Primary care provider: Patient, No Pcp Per (Inactive) ? ?Review of Systems  ?Constitutional:  Negative for activity change, appetite change, chills, diaphoresis, fatigue, fever and unexpected weight change.  ?HENT:  Negative for congestion, dental problem, drooling, ear discharge, ear pain, facial swelling, hearing loss, mouth sores, nosebleeds, postnasal drip, rhinorrhea, sinus pressure, sinus pain, sneezing, sore throat, tinnitus, trouble swallowing and voice change.   ?Eyes:  Negative for photophobia, pain, discharge, redness, itching and visual disturbance.  ?Respiratory:  Negative for apnea, cough, choking, chest tightness, shortness of breath, wheezing and stridor.   ?Cardiovascular:  Negative for chest pain, palpitations and leg swelling.  ?Gastrointestinal:  Negative for abdominal distention, abdominal pain, anal bleeding, blood in stool, constipation, diarrhea, nausea, rectal pain and vomiting.  ?Endocrine: Negative for cold intolerance, heat intolerance, polydipsia, polyphagia and polyuria.  ?Genitourinary:  Negative for decreased urine volume, difficulty urinating, dyspareunia, dysuria, enuresis, flank pain, frequency, genital sores, hematuria, menstrual problem, pelvic pain, urgency, vaginal bleeding, vaginal discharge and vaginal pain.  ?Musculoskeletal:  Negative for arthralgias, back pain, gait problem, joint swelling, myalgias, neck pain and neck stiffness.   ?Skin:  Negative for color change, pallor, rash and wound.  ?Neurological:  Negative for dizziness, tremors, seizures, syncope, facial asymmetry, speech difficulty, weakness, light-headedness, numbness and headaches.  ?Hematological:  Negative for adenopathy. Does not bruise/bleed easily.  ?Psychiatric/Behavioral:  Negative for behavioral problems, confusion, decreased concentration, dysphoric mood, hallucinations, self-injury, sleep disturbance and suicidal ideas. The patient is not nervous/anxious and is not hyperactive.    ? ?Menstrual history: ?Period Cycle (Days): 28 ?Period Duration (Days): 5 ?Period Pattern: Regular ?Menstrual Flow: Heavy, Moderate, Light ?Dysmenorrhea: None ? ?Past Medical History:  ?Diagnosis Date  ? ADHD (attention deficit hyperactivity disorder)   ? Anemia   ? Chlamydia contact, treated 11/2016  ? Preeclampsia, third trimester 07/10/2019  ? ?Past Surgical History:  ?Procedure Laterality Date  ? CESAREAN SECTION N/A 07/12/2019  ? Procedure: CESAREAN SECTION;  Surgeon: Will Bonnet, MD;  Location: [facility] ORS;  Service: Obstetrics;  Laterality: N/A;  ? NONE    ? ?Family History  ?Problem Relation Age of Onset  ? Diabetes Mother   ? Hypertension Mother   ? Asthma Maternal Aunt   ? Hypertension Maternal Grandmother   ? ?Social History  ? ?Socioeconomic History  ? Marital status: Single  ?  Spouse name: NA  ? Number of children: 1  ? Years of education: 57  ? Highest education level: High school graduate  ?Occupational History  ? Occupation: [employer]  ?Tobacco Use  ? Smoking status: Former  ?  Types: Cigarettes, Cigars  ? Smokeless tobacco: Never  ?Vaping Use  ? Vaping Use: Never used  ?Substance and Sexual Activity  ? Alcohol use: Yes  ?  Alcohol/week: 1.0 standard drink  ?  Types: 1 Standard drinks or equivalent per week  ?  Comment: 2x/mo  ? Drug use: Not Currently  ?  Types: Marijuana, LSD  ?  Comment: last used 12/2020  ? Sexual activity: Yes  ?  Partners: Male  ?  Birth  control/protection: None  ?Other Topics Concern  ? Not on file  ?Social History Narrative  ? Patient is a single mom. She lives with her mom and her sisters. She reports having good family support and also has friends.   ? ?Social Determinants of Health  ? ?Financial Resource Strain: Not on file  ?Food Insecurity: Not on file  ?Transportation Needs: Not on file  ?Physical Activity: Not on file  ?Stress: Not on file  ?Social Connections: Not on file  ?Intimate Partner Violence: Not At Risk  ? Fear of Current or Ex-Partner: No  ? Emotionally Abused: No  ? Physically Abused: No  ? Sexually Abused: No  ? ?History of bnormal paps.  ?History of trichomonoas and gonorrhea ?Patient denies domestic violence or sexual abuse ?Patient has received 1/3 of Gardisil series ? ?Health Maintenance  ?Topic Date Due  ? COVID-19 Vaccine (1) Never done  ? HPV VACCINES (3 - 3-dose series) 03/21/2022  ? INFLUENZA VACCINE  02/10/2022  ? PAP-Cervical Cytology Screening  09/20/2022  ? PAP SMEAR-Modifier  09/20/2022  ? TETANUS/TDAP  05/23/2029  ? Hepatitis C Screening  Completed  ? HIV Screening  Completed  ? ?Medicine list and allergies reviewed and updated.   ?  ?Objective:  ?BP 120/80   Ht '5\' 3"'$  (1.6 m)   Wt 176 lb (79.8 kg)   LMP 11/07/2021   BMI 31.18 kg/m?  ?Physical Exam ?Vitals and nursing note reviewed.  ?Constitutional:   ?   Appearance: Normal appearance.  ?HENT:  ?   Head: Normocephalic and atraumatic.  ?Eyes:  ?   Extraocular Movements: Extraocular movements intact.  ?Pulmonary:  ?   Effort: Pulmonary effort is normal.  ?Musculoskeletal:     ?   General: Normal range of motion.  ?   Cervical back: Normal range of motion.  ?Skin: ?   General: Skin is warm and dry.  ?Neurological:  ?   General: No focal deficit present.  ?   Mental Status: She is alert and oriented to person, place, and time.  ?Psychiatric:     ?   Mood and Affect: Mood normal.     ?   Behavior: Behavior normal.     ?   Thought Content: Thought content normal.   ? ?Assessment/Plan:  ? ?Birth control counseling - Plan: POCT urine pregnancy ? ?Need for HPV vaccination ? ?LGSIL on Pap smear of cervix ? ?Breast feeding status of mother ? ?Pap is due return for pap with prior abnormal pap. Plan annual with birth control follow up  ?Contraception: Education given regarding options for contraception: ?Nonhormonal options include: ?Phexxi - single use insert used within effective for up to one hour before each sexual act ?Barrier methods including Condom, spermicidal and diaphragm, vaginal contraceptive film, female condom ?Paragard IUD ?Progesterone only options include ?Depo Provera, Mirena, Kyleena, [place], pills Micronor vs Slynd and Nexplanon.  ?Combo options include: Pills, Nuvaring vs Annovera, patch  ?Discussed emergency contraception which patient may still take as she is in her ovulatory window with unprotected intercourse yesterday. This is available OTC. ?Patient desires phexxi. We discussed when breast feeding is complete she can transition to desired Annovera. Phexxi brochure given.  ?Gardisil 2/3 today return in 4 mo for #3 ? ?Follow up : Return in about 3 months (around 02/18/2022), or Birth control follow up. ? ?

## 2021-11-18 NOTE — Patient Instructions (Signed)
Have a great year! Please call with any concerns. ?Don't forget to wear your seatbelt everyday! ?If you are not signed up on [place], please ask [place] how to sign up for it!  ? ?In a world where you can be anything, please be kind.  ? ?

## 2022-03-17 ENCOUNTER — Ambulatory Visit: Payer: Medicaid Other

## 2022-03-17 NOTE — Progress Notes (Deleted)
25 y.o. Single female here for her third Gardasil injection.  LMP:  ***  Contraception:  norethindrone-ethinyl estradiol-FE (LOESTRIN FE) 1-20 MG-MCG tablet  Order to administer given by Dr. April Manson on 11/18/2021.  Given by: Otila Kluver, LPN  Site:  Right deltoid  Plan: Gardasil series complete. Return to clinic prn.

## 2023-03-08 ENCOUNTER — Ambulatory Visit: Payer: Medicaid Other | Admitting: Obstetrics & Gynecology

## 2023-03-22 ENCOUNTER — Ambulatory Visit: Payer: Medicaid Other | Admitting: Obstetrics & Gynecology

## 2023-11-08 ENCOUNTER — Encounter: Payer: Self-pay | Admitting: Student

## 2023-11-08 DIAGNOSIS — Z1231 Encounter for screening mammogram for malignant neoplasm of breast: Secondary | ICD-10-CM
